# Patient Record
Sex: Female | Born: 1985 | Race: White | Hispanic: No | Marital: Single | Smoking: Never smoker
Health system: Southern US, Community
[De-identification: ages and names within clinical notes are randomized; demographics above are authoritative.]

## PROBLEM LIST (undated history)

## (undated) DIAGNOSIS — M419 Scoliosis, unspecified: Secondary | ICD-10-CM

## (undated) DIAGNOSIS — IMO0001 Reserved for inherently not codable concepts without codable children: Secondary | ICD-10-CM

## (undated) DIAGNOSIS — Q631 Lobulated, fused and horseshoe kidney: Secondary | ICD-10-CM

## (undated) DIAGNOSIS — H9190 Unspecified hearing loss, unspecified ear: Secondary | ICD-10-CM

## (undated) DIAGNOSIS — G629 Polyneuropathy, unspecified: Secondary | ICD-10-CM

## (undated) DIAGNOSIS — F419 Anxiety disorder, unspecified: Secondary | ICD-10-CM

## (undated) HISTORY — PX: TONSILLECTOMY: SUR1361

## (undated) HISTORY — PX: MYRINGOTOMY: SUR874

---
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
## 2007-04-22 ENCOUNTER — Emergency Department (HOSPITAL_COMMUNITY): Admission: EM | Admit: 2007-04-22 | Discharge: 2007-04-22 | Payer: Self-pay | Admitting: Emergency Medicine

## 2007-07-02 ENCOUNTER — Emergency Department (HOSPITAL_COMMUNITY): Admission: EM | Admit: 2007-07-02 | Discharge: 2007-07-02 | Payer: Self-pay | Admitting: Emergency Medicine

## 2007-08-04 ENCOUNTER — Emergency Department (HOSPITAL_COMMUNITY): Admission: EM | Admit: 2007-08-04 | Discharge: 2007-08-04 | Payer: Self-pay | Admitting: Emergency Medicine

## 2007-10-24 ENCOUNTER — Emergency Department (HOSPITAL_COMMUNITY): Admission: EM | Admit: 2007-10-24 | Discharge: 2007-10-25 | Payer: Self-pay | Admitting: Emergency Medicine

## 2007-11-13 ENCOUNTER — Ambulatory Visit (HOSPITAL_COMMUNITY): Admission: RE | Admit: 2007-11-13 | Discharge: 2007-11-13 | Payer: Self-pay | Admitting: Family Medicine

## 2008-01-13 ENCOUNTER — Ambulatory Visit (HOSPITAL_COMMUNITY): Admission: RE | Admit: 2008-01-13 | Discharge: 2008-01-13 | Payer: Self-pay | Admitting: Family Medicine

## 2008-03-27 ENCOUNTER — Emergency Department (HOSPITAL_COMMUNITY): Admission: EM | Admit: 2008-03-27 | Discharge: 2008-03-27 | Payer: Self-pay | Admitting: Emergency Medicine

## 2010-03-12 ENCOUNTER — Emergency Department (HOSPITAL_COMMUNITY): Admission: EM | Admit: 2010-03-12 | Discharge: 2010-03-12 | Payer: Self-pay | Admitting: Emergency Medicine

## 2010-06-18 ENCOUNTER — Emergency Department (HOSPITAL_COMMUNITY): Admission: EM | Admit: 2010-06-18 | Discharge: 2010-06-18 | Payer: Self-pay | Admitting: Emergency Medicine

## 2010-08-18 ENCOUNTER — Ambulatory Visit (HOSPITAL_COMMUNITY): Admission: RE | Admit: 2010-08-18 | Discharge: 2010-08-18 | Payer: Self-pay | Admitting: Internal Medicine

## 2010-09-19 ENCOUNTER — Ambulatory Visit: Payer: Self-pay | Admitting: Internal Medicine

## 2010-09-21 ENCOUNTER — Ambulatory Visit: Payer: Self-pay | Admitting: Internal Medicine

## 2010-09-21 ENCOUNTER — Ambulatory Visit (HOSPITAL_COMMUNITY): Admission: RE | Admit: 2010-09-21 | Discharge: 2010-09-21 | Payer: Self-pay | Admitting: Internal Medicine

## 2010-09-21 DIAGNOSIS — K219 Gastro-esophageal reflux disease without esophagitis: Secondary | ICD-10-CM | POA: Insufficient documentation

## 2010-09-21 DIAGNOSIS — R1013 Epigastric pain: Secondary | ICD-10-CM

## 2010-09-27 ENCOUNTER — Telehealth (INDEPENDENT_AMBULATORY_CARE_PROVIDER_SITE_OTHER): Payer: Self-pay

## 2010-09-27 ENCOUNTER — Encounter: Payer: Self-pay | Admitting: Gastroenterology

## 2010-09-28 ENCOUNTER — Encounter: Payer: Self-pay | Admitting: Gastroenterology

## 2010-09-29 ENCOUNTER — Encounter: Payer: Self-pay | Admitting: Internal Medicine

## 2010-09-29 LAB — CONVERTED CEMR LAB
ALT: <8 units/L (ref 0–35)
AST: 13 units/L (ref 0–37)
Albumin: 4.5 g/dL (ref 3.5–5.2)
Alkaline Phosphatase: 56 units/L (ref 39–117)
BUN: 11 mg/dL (ref 6–23)
Basophils Absolute: 0 10*3/uL (ref 0.0–0.1)
Basophils Relative: 1 % (ref 0–1)
CO2: 28 meq/L (ref 19–32)
Calcium: 9.7 mg/dL (ref 8.4–10.5)
Chloride: 106 meq/L (ref 96–112)
Creatinine, Ser: 0.67 mg/dL (ref 0.40–1.20)
Eosinophils Absolute: 0 10*3/uL (ref 0.0–0.7)
Eosinophils Relative: 1 % (ref 0–5)
Glucose, Bld: 90 mg/dL (ref 70–99)
HCT: 38.4 % (ref 36.0–46.0)
Hemoglobin: 11.8 g/dL — ABNORMAL LOW (ref 12.0–15.0)
Lipase: 32 units/L (ref 0–75)
Lymphocytes Relative: 29 % (ref 12–46)
Lymphs Abs: 2.4 10*3/uL (ref 0.7–4.0)
MCHC: 30.7 g/dL (ref 30.0–36.0)
MCV: 69.4 fL — ABNORMAL LOW (ref 78.0–100.0)
Monocytes Absolute: 0.6 10*3/uL (ref 0.1–1.0)
Monocytes Relative: 7 % (ref 3–12)
Neutro Abs: 5.1 10*3/uL (ref 1.7–7.7)
Neutrophils Relative %: 63 % (ref 43–77)
Platelets: 316 10*3/uL (ref 150–400)
Potassium: 4.4 meq/L (ref 3.5–5.3)
RBC: 5.53 M/uL — ABNORMAL HIGH (ref 3.87–5.11)
RDW: 15.1 % (ref 11.5–15.5)
Sodium: 143 meq/L (ref 135–145)
Total Bilirubin: 0.4 mg/dL (ref 0.3–1.2)
Total Protein: 7.1 g/dL (ref 6.0–8.3)
WBC: 8.2 10*3/uL (ref 4.0–10.5)

## 2010-10-02 ENCOUNTER — Ambulatory Visit (HOSPITAL_COMMUNITY): Admission: RE | Admit: 2010-10-02 | Discharge: 2010-10-02 | Payer: Self-pay | Admitting: Internal Medicine

## 2010-10-02 ENCOUNTER — Ambulatory Visit: Payer: Self-pay | Admitting: Internal Medicine

## 2010-10-02 DIAGNOSIS — D649 Anemia, unspecified: Secondary | ICD-10-CM

## 2010-10-11 ENCOUNTER — Ambulatory Visit: Payer: Self-pay | Admitting: Internal Medicine

## 2010-10-12 ENCOUNTER — Telehealth (INDEPENDENT_AMBULATORY_CARE_PROVIDER_SITE_OTHER): Payer: Self-pay

## 2010-12-20 ENCOUNTER — Ambulatory Visit (HOSPITAL_COMMUNITY)
Admission: RE | Admit: 2010-12-20 | Discharge: 2010-12-20 | Payer: Self-pay | Source: Home / Self Care | Attending: Family Medicine | Admitting: Family Medicine

## 2010-12-31 ENCOUNTER — Encounter: Payer: Self-pay | Admitting: Internal Medicine

## 2011-01-05 ENCOUNTER — Encounter
Admission: RE | Admit: 2011-01-05 | Discharge: 2011-01-09 | Payer: Self-pay | Source: Home / Self Care | Attending: Orthopedic Surgery | Admitting: Orthopedic Surgery

## 2011-01-11 NOTE — Letter (Signed)
Summary: EGD ORDER  EGD ORDER   Imported By: Ave Filter 09/19/2010 15:24:04  _____________________________________________________________________  External Attachment:    Type:   Image     Comment:   External Document

## 2011-01-11 NOTE — Letter (Signed)
Summary: CT SCAN ORDER  CT SCAN ORDER   Imported By: Ave Filter 09/29/2010 11:34:04  _____________________________________________________________________  External Attachment:    Type:   Image     Comment:   External Document

## 2011-01-11 NOTE — Assessment & Plan Note (Signed)
Summary: DROPPED OFF STOOL/SS   Returned one iFOBT and it was negative.      Allergies: 1)  ! Amoxicillin 2)  ! Lodine 3)  ! Pyridium  Other Orders: Immuno-chemical Fecal Occult (11914)  Appended Document: DROPPED OFF STOOL/SS noted

## 2011-01-11 NOTE — Progress Notes (Signed)
Summary: insurance wont pay for rx  Phone Note Call from Patient Call back at [facility] Phone [phone]   Caller: Patient Summary of Call: pt called, saw LSL yesterday, insurance wont pay for medication. pt requesting something else [facility] Initial call taken by: Hendricks Limes LPN,  October 12, 2010 3:10 PM     Appended Document: insurance wont pay for rx we can do Bentyl 10 mg by mouth qac and at bedtime. Disp# 120 no refills  Appended Document: insurance wont pay for rx [facility]- pt aware

## 2011-01-11 NOTE — Progress Notes (Signed)
Summary: phone note/ still having abd pain  Phone Note Call from Patient   Caller: Patient Summary of Call: Pt called and said the Dexilant is not helping. She has constant abd pain and sometimes it is severe. She would like to know what her next step is. I told her Dr. Jena Gauss is out of the office but i will ask Tana Coast, PA. Initial call taken by: Cloria Spring LPN,  September 27, 2010 2:18 PM     Appended Document: phone note/ still having abd pain Need CBC, CMET, lipase as next step. May ultimately need CT A/P but let's wait for labwork first.  Appended Document: phone note/ still having abd pain LMOM to call. Will print lab orders and have available to fax.  Appended Document: phone note/ still having abd pain Pt called and was informed. Lab order faxed to [facility].

## 2011-01-11 NOTE — Letter (Signed)
Summary: RADIOLOGY REPORT U/S ABD  RADIOLOGY REPORT U/S ABD   Imported By: Rexene Alberts 09/19/2010 16:38:38  _____________________________________________________________________  External Attachment:    Type:   Image     Comment:   External Document

## 2011-01-11 NOTE — Assessment & Plan Note (Signed)
Summary: move ov up w/ext next week per RMR/ss   Visit Type:  f/u Primary Care Provider:  [facility]  Chief Complaint:  follow up visit, still having abd pain, and worse at night.  History of Present Illness: Patient is here for f/u of abd pain. She has had EGD, CT A/P, ABD U/S, labs which have failed to reveal cause of her symptoms. She took trial of PPI without relief. 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 She had mild anemia with microcytosis (hgb 11.8, MCV 69). ifobt was negative.   Constant abd pain but around 8pm pain gets worse. Pain severe and lays in floor for hours. Lives alone. Pain medication not helping. No n/v. BM regular. Heartburn better. Dexilant couple of weeks and didn't seem to help so she stopped it. Pain in mid-abd. Movement does not affect pain nor does meal. No gu issues. Currently pain is at eight level but she appears very comfortable and smiles throughout OV. She denies sexual activity. Notes symptoms worse since 8/11 when college started back. This is her fifth semester but classes are much harder.   Current Medications (verified): 1)  Vicodin .... As Needed  Allergies (verified): 1)  ! Amoxicillin 2)  ! Lodine 3)  ! Pyridium  Past History:  Past Medical History: Scoliosis Horse shoe kidney    Review of Systems      See HPI  Vital Signs:  Patient profile:   25 year old female Height:      72 inches Weight:      204 pounds BMI:     27.77 Temp:     98.6 degrees F oral Pulse rate:   76 / minute BP sitting:   112 / 80  (left arm) Cuff size:   regular  Vitals Entered By: Hendricks Limes LPN (October 11, 2010 2:15 PM)  Physical Exam  General:  Well developed, well nourished, no acute distress.obese.   Head:  Normocephalic and atraumatic. Eyes:  sclera nonicteric Mouth:  op moist. poor dentition.   Lungs:  Clear throughout to auscultation. Heart:  Regular rate and rhythm; no murmurs, rubs,  or bruits. Abdomen:  Positive BS. Soft.  Mild mid abd tenderness to deep palpation. No rebound or guarding. No HSM or masses. No abd bruit or hernia.  Extremities:  No clubbing, cyanosis, edema or deformities noted. Neurologic:  Alert and  oriented x4;  grossly normal neurologically. Skin:  Intact without significant lesions or rashes. Psych:  Alert and cooperative. Normal mood and affect.  Impression & Recommendations:  Problem # 1:  EPIGASTRIC PAIN (ICD-789.06)  Epigastric/mid-abd pain unrelated to meals or movements. Patient c/o constant pain with increased severity in evenings around 8pm. C/O pain currently, rating at 8 level, but appears very comfortable on exam/smiles alot. Unlikely we are dealing with gb issue. ?functional abd pain or pain related to horseshoe kidney with intermittent obstruction? Will start antispasmotic. PR in 2 weeks. Will review films with radiologist.   Orders: Est. Patient Level II (04540)  Problem # 2:  ANEMIA (ICD-285.9) Recheck labs in two weeks. If any indication of IDA, consider TCS to complete w/u.  Orders: T-CBC w/Diff [phone]) T-Ferritin [phone]) T-Iron [phone]) T-Iron Binding Capacity (TIBC) ([id number]) Est. Patient Level II (02725)  Problem # 3:  GERD (ICD-530.81) Denies typical GERD symptoms. No longer on PPI. Didn't appreciate any improvement in abd pain on PPI. Orders: Est. Patient Level II (36644) Prescriptions: HYOMAX-SR 0.375 MG XR12H-TAB (HYOSCYAMINE SULFATE) one by mouth  two times a day for abd pain  #60 x 2   Entered and Authorized by:   Leanna Battles. Dixon Boos   Signed by:   Leanna Battles Lewis PA-C on 10/11/2010   Method used:   [facility]        [phone]retail[facility]       [address]       [place], [place]  [address]       Ph: [phone] or [phone]       Fax: [phone]   RxID:   [id number]

## 2011-01-11 NOTE — Letter (Signed)
Summary: LABS  LABS   Imported By: Rexene Alberts 09/19/2010 16:39:50  _____________________________________________________________________  External Attachment:    Type:   Image     Comment:   External Document

## 2011-01-11 NOTE — Miscellaneous (Signed)
Summary: Orders Update  Clinical Lists Changes  Orders: Added new Test order of T-CBC w/Diff [id number]) - Signed Added new Test order of T-Comprehensive Metabolic Panel [phone]) - Signed Added new Test order of T-Lipase [phone]) - Signed

## 2011-01-11 NOTE — Assessment & Plan Note (Signed)
Summary: ABD PAIN/LAW   Visit Type:  Initial Consult Referring Indiya Izquierdo:  Sherwood Gambler Primary Care Nataliee Shurtz:  [facility]  Chief Complaint:  abdominal pain.  History of Present Illness: 25 year old lady sent over out of the coutrtesy of Dr. Sherwood Gambler to further evaluate a two-month history of progressive worsening epigastric pain. She states she has this pain constantly, unrelenting for 2 months - now 24/ 7. She's also had significant heartburn symptoms over the past couple of weeks. No dysphagia some early satiety; no nausea or vomiting. no melena or hematochezia; she denies constipation or diarrhea. She reported she had an abdominal ultrasound which did not show any significant findings but I do not have a report available for review at this time.  She reportedly had a battery of the labs ordered including a CMP, amylase and CBC but Dr. Sharyon Medicus office states she did not have any lab work on this patietn aside from a negative preganacy test;  urinalysis was positive for ketones and leukocyte esterase. She was treated with a course of Cipro, howeve,  this did not help her symptoms. She denies any flank pain.  She denies any fever or chills. She denies weight loss. There is no family history of chronic gastrointestinal / liver illness.  She denies tobacco alcohol, NSAIDs or illicit drugs.  course of vicodin has had minimal effect on her symptoms. Eating a meal does not effect the pain anyway.    Current Medications (verified): 1)  Vicodin .... As Needed  Allergies (verified): 1)  ! Amoxicillin 2)  ! Lodine 3)  ! Pyridium  Past History:  Family History: Last updated: 09/19/2010 Father: Living age 39   healthy Mother:Living age 65   healthy Siblings: None  Social History: Last updated: 09/19/2010 Marital Status: Single Children: None Occupation: Mining engineer  Past Medical History: Scoliosis Horse shoe kidney (per pt)  Past Surgical History: Tubes in ears  ( about  22 times per pt ) Tonsils and adenoids  Family History: Father: Living age 44   healthy Mother:Living age 23   healthy Siblings: None  Social History: Marital Status: Single Children: None Occupation: Mining engineer  Vital Signs:  Patient profile:   25 year old female Height:      72 inches Weight:      204 pounds BMI:     27.77 Temp:     97.8 degrees F oral Pulse rate:   66 / minute BP sitting:   100 / 76  (left arm) Cuff size:   large  Vitals Entered By: Cloria Spring LPN (September 19, 2010 2:38 PM)  Physical Exam  General:  the patient is alert conversant appears comfortable and pleasant. Somewhat disheveled Lungs:  clear to auscultation Heart:  regular rate and rhythm without murmur gallop rub Abdomen:  nondistended positive bowel sounds she does have tenderness localized to the epigastric area to palpation. No appreciable mass or hepatosplenomegaly Rectal:  good sphincter tone. No masses in the rectal vault. No stool in the rectal vault. Mucous is Hemoccult negative.  Impression & Recommendations: Impression: 25 year old lady with a two-month history of progressively worsening epigastric pain along with symptoms she describes as heartburn particularly past 2-3 weeks. She is not improved wtih a course of Cipro for cystitis. She reportedly had an abdominal ultrasound which was unrevealing as to the cause of her symptoms. She does have some early satiety symptoms. Symptoms are atypical for both gallbladder disease or peptic ulcer disease. She  does appea to have a reflux component. Further evaluation is warranted.  Recommendations: Diagnostic EGD in the near future. I discussed risks, benefits, limitations, imponderables and alternatives with this nice ladyr. Her questions have been answered. I believe she understands and is agreeable.  We'll also give her a two-week course of Dexalant 60 mg orally daily - samples provided from the office to see if  this has a  positive impact on her symptoms. Further recommendations to follow after  EGD has been performed. I want to thank Dr. Sherwood Gambler for allowing me to see this nice lady today.   addendum:   ultrasound report has arrived. Hepatobiliary tree appeared normal. Pancreas not seen because of overlying bowel gas.  Appended Document: Orders Update    Clinical Lists Changes  Problems: Added new problem of GERD (ICD-530.81) Added new problem of EPIGASTRIC PAIN (ICD-789.06) Orders: Added new Service order of Consultation Level IV [phone]) - Signed

## 2011-01-16 ENCOUNTER — Encounter: Payer: Self-pay | Admitting: Physical Therapy

## 2011-01-18 ENCOUNTER — Ambulatory Visit: Payer: Medicaid Other | Attending: Orthopedic Surgery | Admitting: Physical Therapy

## 2011-01-18 ENCOUNTER — Encounter: Payer: Self-pay | Admitting: Physical Therapy

## 2011-01-18 DIAGNOSIS — IMO0001 Reserved for inherently not codable concepts without codable children: Secondary | ICD-10-CM | POA: Insufficient documentation

## 2011-01-18 DIAGNOSIS — M2550 Pain in unspecified joint: Secondary | ICD-10-CM | POA: Insufficient documentation

## 2011-01-18 DIAGNOSIS — R293 Abnormal posture: Secondary | ICD-10-CM | POA: Insufficient documentation

## 2011-01-22 ENCOUNTER — Encounter: Payer: Medicaid Other | Admitting: Physical Therapy

## 2011-02-28 LAB — URINE MICROSCOPIC-ADD ON

## 2011-02-28 LAB — URINALYSIS, ROUTINE W REFLEX MICROSCOPIC
Bilirubin Urine: NEGATIVE
Glucose, UA: NEGATIVE mg/dL
Ketones, ur: NEGATIVE mg/dL
Nitrite: POSITIVE — AB
Protein, ur: 100 mg/dL — AB
Specific Gravity, Urine: 1.025 (ref 1.005–1.030)
Urobilinogen, UA: 0.2 mg/dL (ref 0.0–1.0)
pH: 6.5 (ref 5.0–8.0)

## 2011-02-28 LAB — POCT I-STAT, CHEM 8
BUN: 15 mg/dL (ref 6–23)
Calcium, Ion: 1.2 mmol/L (ref 1.12–1.32)
Chloride: 106 mEq/L (ref 96–112)
Creatinine, Ser: 0.6 mg/dL (ref 0.4–1.2)
Glucose, Bld: 97 mg/dL (ref 70–99)
HCT: 42 % (ref 36.0–46.0)
Hemoglobin: 14.3 g/dL (ref 12.0–15.0)
Potassium: 4.1 mEq/L (ref 3.5–5.1)
Sodium: 140 mEq/L (ref 135–145)
TCO2: 27 mmol/L (ref 0–100)

## 2011-02-28 LAB — HCG, QUANTITATIVE, PREGNANCY: hCG, Beta Chain, Quant, S: <2 m[IU]/mL (ref <5)

## 2011-02-28 LAB — PREGNANCY, URINE: Preg Test, Ur: POSITIVE

## 2011-03-27 ENCOUNTER — Emergency Department (HOSPITAL_COMMUNITY)
Admission: EM | Admit: 2011-03-27 | Discharge: 2011-03-27 | Disposition: A | Discharge status: Home / Self Care | Payer: Medicaid Other | Attending: Emergency Medicine | Admitting: Emergency Medicine

## 2011-03-27 DIAGNOSIS — M79609 Pain in unspecified limb: Secondary | ICD-10-CM | POA: Insufficient documentation

## 2011-03-27 DIAGNOSIS — IMO0001 Reserved for inherently not codable concepts without codable children: Secondary | ICD-10-CM | POA: Insufficient documentation

## 2011-03-27 LAB — POCT I-STAT, CHEM 8
BUN: 11 mg/dL (ref 6–23)
Calcium, Ion: 1.24 mmol/L (ref 1.12–1.32)
Chloride: 103 mEq/L (ref 96–112)
Creatinine, Ser: 0.9 mg/dL (ref 0.4–1.2)
Glucose, Bld: 82 mg/dL (ref 70–99)
HCT: 39 % (ref 36.0–46.0)
Hemoglobin: 13.3 g/dL (ref 12.0–15.0)
Potassium: 3.7 mEq/L (ref 3.5–5.1)
Sodium: 142 mEq/L (ref 135–145)
TCO2: 28 mmol/L (ref 0–100)

## 2011-04-27 NOTE — Procedures (Signed)
   NAMECONNER, [name]                           ACCOUNT NO.:  [id number]   MEDICAL RECORD NO.:  [id number]                    PATIENT TYPE:   LOCATION:                                       FACILITY:   PHYSICIAN:  Edward L. Juanetta Gosling, M.D.             DATE OF BIRTH:   DATE OF PROCEDURE:  09/07/2003  DATE OF DISCHARGE:                                EKG INTERPRETATION   TIME AND DATE:  1949 on 09/07/2003   INTERPRETATION:  The rhythm is sinus rhythm with a rate in the 90s.  There  are Q waves inferiorly which could indicate a previous inferior myocardial  infarction.  There is suggestion of a left ventricular hypertrophy.  ST-T  wave changes appear nonspecific, but may be related to the LVH.   IMPRESSION:  Abnormal electrocardiogram.      ___________________________________________                                            Oneal Deputy. Juanetta Gosling, M.D.   ELH/MEDQ  D:  09/16/2003  T:  09/17/2003  Job:  [id number]

## 2011-04-27 NOTE — Op Note (Signed)
   Wendy [name], Wendy [name]                         ACCOUNT NO.:  [id number]   MEDICAL RECORD NO.:  [id number]                   PATIENT TYPE:  AMB   LOCATION:  [facility]                                  FACILITY:  [facility]   PHYSICIAN:  Jefry H. Pollyann Kennedy, M.D.                DATE OF BIRTH:  01-30-1986   DATE OF PROCEDURE:  09/17/2002  DATE OF DISCHARGE:                                 OPERATIVE REPORT   PREOPERATIVE DIAGNOSES:  1. Chronic eustachian tube dysfunction.  2. 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   POSTOPERATIVE DIAGNOSES:  1. Chronic eustachian tube dysfunction.  2. 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   PROCEDURE:  Revision T tube placement, right ear.   SURGEON:  Jefry H. Pollyann Kennedy, M.D.   ANESTHESIA:  Inhalation.   COMPLICATIONS:  None.   FINDINGS:  Displaced T tube up against the posterior annulus with thin  mucoid middle ear effusion.  Left side clear with T tube in place.   HISTORY:  [name] 25 year old with chronic eustachian tube dysfunction and chronic  sensor neural hearing loss.  Risks, benefits, alternatives, and  complications of the procedure explained to the parents and same agreed to  surgery.   DESCRIPTION OF PROCEDURE:  The patient was taken to the operating room and  placed on the operating table in [name] supine position.  Following induction of  mask inhalation anesthesia, the ears were examined using operating  microscope bilaterally.  The tube was removed from the right side.  [name] new  anterior-inferior myringotomy incision was created in the inferior quadrant,  and then the mucoid effusion was aspirated.  [name] new modified T tube was  placed without difficulty and secured in position.  Cortisporin was  instilled into the ear canal.  The patient was then awakened from anesthesia  and transferred to recovery in stable condition.                               Jefry H. Pollyann Kennedy, M.D.    JHR/MEDQ  D:  09/17/2002  T:  09/17/2002  Job:  [id number]

## 2011-06-08 ENCOUNTER — Emergency Department (HOSPITAL_COMMUNITY): Payer: Medicaid Other

## 2011-06-08 ENCOUNTER — Emergency Department (HOSPITAL_COMMUNITY)
Admission: EM | Admit: 2011-06-08 | Discharge: 2011-06-09 | Disposition: A | Discharge status: Home / Self Care | Payer: Medicaid Other | Attending: Emergency Medicine | Admitting: Emergency Medicine

## 2011-06-08 DIAGNOSIS — R0609 Other forms of dyspnea: Secondary | ICD-10-CM | POA: Insufficient documentation

## 2011-06-08 DIAGNOSIS — R0989 Other specified symptoms and signs involving the circulatory and respiratory systems: Secondary | ICD-10-CM | POA: Insufficient documentation

## 2011-06-08 DIAGNOSIS — R079 Chest pain, unspecified: Secondary | ICD-10-CM | POA: Insufficient documentation

## 2011-06-08 LAB — CBC
HCT: 41.1 % (ref 36.0–46.0)
Hemoglobin: 12.9 g/dL (ref 12.0–15.0)
MCH: 22.3 pg — ABNORMAL LOW (ref 26.0–34.0)
MCHC: 31.4 g/dL (ref 30.0–36.0)
MCV: 71.1 fL — ABNORMAL LOW (ref 78.0–100.0)
Platelets: 294 10*3/uL (ref 150–400)
RBC: 5.78 MIL/uL — ABNORMAL HIGH (ref 3.87–5.11)
RDW: 14.2 % (ref 11.5–15.5)
WBC: 8.3 10*3/uL (ref 4.0–10.5)

## 2011-06-08 LAB — COMPREHENSIVE METABOLIC PANEL
ALT: 11 U/L (ref 0–35)
AST: 15 U/L (ref 0–37)
Albumin: 4 g/dL (ref 3.5–5.2)
Alkaline Phosphatase: 75 U/L (ref 39–117)
Potassium: 3.8 mEq/L (ref 3.5–5.1)
Sodium: 138 mEq/L (ref 135–145)
Total Protein: 8 g/dL (ref 6.0–8.3)

## 2011-06-08 LAB — TROPONIN I: Troponin I: <0.3 ng/mL (ref <0.30)

## 2011-06-08 LAB — CK TOTAL AND CKMB (NOT AT ARMC)
CK, MB: 1.3 ng/mL (ref 0.3–4.0)
Relative Index: INVALID (ref 0.0–2.5)
Total CK: 69 U/L (ref 7–177)

## 2011-08-01 ENCOUNTER — Other Ambulatory Visit (HOSPITAL_COMMUNITY): Payer: Self-pay | Admitting: Family Medicine

## 2011-08-01 ENCOUNTER — Ambulatory Visit (HOSPITAL_COMMUNITY)
Admission: RE | Admit: 2011-08-01 | Discharge: 2011-08-01 | Disposition: A | Discharge status: Home / Self Care | Payer: Medicaid Other | Source: Ambulatory Visit | Attending: Family Medicine | Admitting: Family Medicine

## 2011-08-01 DIAGNOSIS — M25569 Pain in unspecified knee: Secondary | ICD-10-CM | POA: Insufficient documentation

## 2011-09-04 LAB — GC/CHLAMYDIA PROBE AMP, GENITAL
Chlamydia, DNA Probe: NEGATIVE
GC Probe Amp, Genital: NEGATIVE

## 2011-09-04 LAB — WET PREP, GENITAL: Trich, Wet Prep: NONE SEEN

## 2011-09-05 ENCOUNTER — Emergency Department (INDEPENDENT_AMBULATORY_CARE_PROVIDER_SITE_OTHER): Payer: No Typology Code available for payment source

## 2011-09-05 ENCOUNTER — Emergency Department (HOSPITAL_BASED_OUTPATIENT_CLINIC_OR_DEPARTMENT_OTHER)
Admission: EM | Admit: 2011-09-05 | Discharge: 2011-09-05 | Disposition: A | Discharge status: Home / Self Care | Payer: No Typology Code available for payment source | Attending: Emergency Medicine | Admitting: Emergency Medicine

## 2011-09-05 ENCOUNTER — Encounter: Payer: Self-pay | Admitting: *Deleted

## 2011-09-05 DIAGNOSIS — S239XXA Sprain of unspecified parts of thorax, initial encounter: Secondary | ICD-10-CM | POA: Insufficient documentation

## 2011-09-05 DIAGNOSIS — M542 Cervicalgia: Secondary | ICD-10-CM

## 2011-09-05 DIAGNOSIS — M4120 Other idiopathic scoliosis, site unspecified: Secondary | ICD-10-CM

## 2011-09-05 DIAGNOSIS — M546 Pain in thoracic spine: Secondary | ICD-10-CM

## 2011-09-05 DIAGNOSIS — S139XXA Sprain of joints and ligaments of unspecified parts of neck, initial encounter: Secondary | ICD-10-CM | POA: Insufficient documentation

## 2011-09-05 DIAGNOSIS — S161XXA Strain of muscle, fascia and tendon at neck level, initial encounter: Secondary | ICD-10-CM

## 2011-09-05 DIAGNOSIS — Y92410 Unspecified street and highway as the place of occurrence of the external cause: Secondary | ICD-10-CM | POA: Insufficient documentation

## 2011-09-05 DIAGNOSIS — IMO0002 Reserved for concepts with insufficient information to code with codable children: Secondary | ICD-10-CM

## 2011-09-05 DIAGNOSIS — M47814 Spondylosis without myelopathy or radiculopathy, thoracic region: Secondary | ICD-10-CM

## 2011-09-05 HISTORY — DX: Anxiety disorder, unspecified: F41.9

## 2011-09-05 HISTORY — DX: Unspecified hearing loss, unspecified ear: H91.90

## 2011-09-05 HISTORY — DX: Scoliosis, unspecified: M41.9

## 2011-09-05 HISTORY — DX: Lobulated, fused and horseshoe kidney: Q63.1

## 2011-09-05 HISTORY — DX: Reserved for inherently not codable concepts without codable children: IMO0001

## 2011-09-05 HISTORY — DX: Polyneuropathy, unspecified: G62.9

## 2011-09-05 MED ORDER — CYCLOBENZAPRINE HCL 10 MG PO TABS: 10.0000 mg | ORAL_TABLET | Freq: Two times a day (BID) | ORAL | Status: AC | PRN

## 2011-09-05 MED ORDER — ONDANSETRON 8 MG PO TBDP
8.0000 mg | ORAL_TABLET | Freq: Once | ORAL | Status: AC
  Administered 2011-09-05: 8 mg via ORAL
  Filled 2011-09-05: qty 1

## 2011-09-05 MED ORDER — IBUPROFEN 800 MG PO TABS: 800.0000 mg | ORAL_TABLET | Freq: Three times a day (TID) | ORAL | Status: AC

## 2011-09-05 MED ORDER — HYDROCODONE-ACETAMINOPHEN 5-325 MG PO TABS: 1.0000 | ORAL_TABLET | ORAL | Status: AC | PRN
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
## 2011-09-18 LAB — URINE MICROSCOPIC-ADD ON

## 2011-09-18 LAB — URINALYSIS, ROUTINE W REFLEX MICROSCOPIC
Bilirubin Urine: NEGATIVE
Glucose, UA: NEGATIVE
Specific Gravity, Urine: >1.03 — ABNORMAL HIGH
Urobilinogen, UA: 0.2

## 2011-09-18 LAB — URINE CULTURE: Colony Count: >=100000

## 2011-09-24 LAB — RAPID URINE DRUG SCREEN, HOSP PERFORMED
Amphetamines: NOT DETECTED
Barbiturates: NOT DETECTED
Opiates: NOT DETECTED

## 2011-09-24 LAB — BASIC METABOLIC PANEL
CO2: 32
Chloride: 103
Creatinine, Ser: 0.72
GFR calc Af Amer: >60
Potassium: 3.9
Sodium: 140

## 2011-09-24 LAB — DIFFERENTIAL
Basophils Absolute: 0
Eosinophils Absolute: 0
Lymphs Abs: 2.9
Neutro Abs: 5.8

## 2011-09-24 LAB — CBC
HCT: 37.9
Hemoglobin: 11.9 — ABNORMAL LOW
MCHC: 31.5
MCV: 67.7 — ABNORMAL LOW
RDW: 16.9 — ABNORMAL HIGH

## 2012-01-11 ENCOUNTER — Ambulatory Visit: Payer: No Typology Code available for payment source | Admitting: Physical Therapy

## 2012-01-16 ENCOUNTER — Ambulatory Visit: Payer: PRIVATE HEALTH INSURANCE | Attending: Orthopedic Surgery | Admitting: Physical Therapy

## 2012-01-16 DIAGNOSIS — M6281 Muscle weakness (generalized): Secondary | ICD-10-CM | POA: Insufficient documentation

## 2012-01-16 DIAGNOSIS — M25669 Stiffness of unspecified knee, not elsewhere classified: Secondary | ICD-10-CM | POA: Insufficient documentation

## 2012-01-16 DIAGNOSIS — IMO0001 Reserved for inherently not codable concepts without codable children: Secondary | ICD-10-CM | POA: Insufficient documentation

## 2012-01-16 DIAGNOSIS — M25569 Pain in unspecified knee: Secondary | ICD-10-CM | POA: Insufficient documentation

## 2012-01-25 ENCOUNTER — Ambulatory Visit: Payer: PRIVATE HEALTH INSURANCE | Admitting: Physical Therapy

## 2012-02-01 ENCOUNTER — Encounter: Payer: No Typology Code available for payment source | Admitting: Physical Therapy

## 2012-02-04 ENCOUNTER — Ambulatory Visit: Payer: PRIVATE HEALTH INSURANCE | Admitting: Physical Therapy

## 2012-02-13 ENCOUNTER — Ambulatory Visit: Payer: PRIVATE HEALTH INSURANCE | Attending: Orthopedic Surgery | Admitting: Physical Therapy

## 2012-02-13 DIAGNOSIS — M25569 Pain in unspecified knee: Secondary | ICD-10-CM | POA: Insufficient documentation

## 2012-02-13 DIAGNOSIS — M6281 Muscle weakness (generalized): Secondary | ICD-10-CM | POA: Insufficient documentation

## 2012-02-13 DIAGNOSIS — IMO0001 Reserved for inherently not codable concepts without codable children: Secondary | ICD-10-CM | POA: Insufficient documentation

## 2012-02-13 DIAGNOSIS — M25669 Stiffness of unspecified knee, not elsewhere classified: Secondary | ICD-10-CM | POA: Insufficient documentation

## 2012-02-15 ENCOUNTER — Encounter: Payer: No Typology Code available for payment source | Admitting: Physical Therapy

## 2013-06-22 ENCOUNTER — Other Ambulatory Visit (HOSPITAL_COMMUNITY): Payer: Self-pay | Admitting: Family Medicine

## 2013-06-22 ENCOUNTER — Ambulatory Visit (HOSPITAL_COMMUNITY)
Admission: RE | Admit: 2013-06-22 | Discharge: 2013-06-22 | Disposition: A | Discharge status: Home / Self Care | Payer: Medicaid Other | Source: Ambulatory Visit | Attending: Family Medicine | Admitting: Family Medicine

## 2013-06-22 DIAGNOSIS — K5900 Constipation, unspecified: Secondary | ICD-10-CM

## 2013-06-22 DIAGNOSIS — R933 Abnormal findings on diagnostic imaging of other parts of digestive tract: Secondary | ICD-10-CM | POA: Insufficient documentation

## 2013-06-22 DIAGNOSIS — O3400 Maternal care for unspecified congenital malformation of uterus, unspecified trimester: Secondary | ICD-10-CM

## 2013-06-24 ENCOUNTER — Emergency Department (HOSPITAL_COMMUNITY)
Admission: EM | Admit: 2013-06-24 | Discharge: 2013-06-25 | Disposition: A | Discharge status: Home / Self Care | Payer: Medicaid Other | Attending: Emergency Medicine | Admitting: Emergency Medicine

## 2013-06-24 ENCOUNTER — Encounter (HOSPITAL_COMMUNITY): Payer: Self-pay | Admitting: Emergency Medicine

## 2013-06-24 DIAGNOSIS — Z8669 Personal history of other diseases of the nervous system and sense organs: Secondary | ICD-10-CM | POA: Insufficient documentation

## 2013-06-24 DIAGNOSIS — N390 Urinary tract infection, site not specified: Secondary | ICD-10-CM | POA: Insufficient documentation

## 2013-06-24 DIAGNOSIS — Z3202 Encounter for pregnancy test, result negative: Secondary | ICD-10-CM | POA: Insufficient documentation

## 2013-06-24 DIAGNOSIS — F411 Generalized anxiety disorder: Secondary | ICD-10-CM | POA: Insufficient documentation

## 2013-06-24 DIAGNOSIS — M549 Dorsalgia, unspecified: Secondary | ICD-10-CM | POA: Insufficient documentation

## 2013-06-24 DIAGNOSIS — K5904 Chronic idiopathic constipation: Secondary | ICD-10-CM

## 2013-06-24 DIAGNOSIS — Z79899 Other long term (current) drug therapy: Secondary | ICD-10-CM | POA: Insufficient documentation

## 2013-06-24 DIAGNOSIS — K5900 Constipation, unspecified: Secondary | ICD-10-CM | POA: Insufficient documentation

## 2013-06-24 DIAGNOSIS — Z8739 Personal history of other diseases of the musculoskeletal system and connective tissue: Secondary | ICD-10-CM | POA: Insufficient documentation

## 2013-06-24 LAB — URINALYSIS, ROUTINE W REFLEX MICROSCOPIC
Nitrite: POSITIVE — AB
Specific Gravity, Urine: 1.03 (ref 1.005–1.030)
Urobilinogen, UA: 1 mg/dL (ref 0.0–1.0)
pH: 6 (ref 5.0–8.0)

## 2013-06-24 LAB — POCT PREGNANCY, URINE: Preg Test, Ur: NEGATIVE

## 2013-06-24 LAB — CBC WITH DIFFERENTIAL/PLATELET
Basophils Absolute: 0 10*3/uL (ref 0.0–0.1)
Eosinophils Absolute: 0.1 10*3/uL (ref 0.0–0.7)
Lymphocytes Relative: 25 % (ref 12–46)
MCHC: 33.3 g/dL (ref 30.0–36.0)
Monocytes Absolute: 1.1 10*3/uL — ABNORMAL HIGH (ref 0.1–1.0)
Neutro Abs: 9.1 10*3/uL — ABNORMAL HIGH (ref 1.7–7.7)
Neutrophils Relative %: 66 % (ref 43–77)
RDW: 16 % — ABNORMAL HIGH (ref 11.5–15.5)

## 2013-06-24 LAB — COMPREHENSIVE METABOLIC PANEL
ALT: 14 U/L (ref 0–35)
Calcium: 9.8 mg/dL (ref 8.4–10.5)
Creatinine, Ser: 0.76 mg/dL (ref 0.50–1.10)
GFR calc Af Amer: >90 mL/min (ref >90)
Glucose, Bld: 108 mg/dL — ABNORMAL HIGH (ref 70–99)
Sodium: 139 mEq/L (ref 135–145)
Total Protein: 7.6 g/dL (ref 6.0–8.3)

## 2013-06-24 LAB — URINE MICROSCOPIC-ADD ON

## 2013-06-24 NOTE — ED Notes (Signed)
Patient states that she has not had a bowel movement in 5 weeks.  Patient has been using senokot and dulcolax without any effect.  Patient with abdominal pain, some nausea, no vomiting.

## 2013-06-25 ENCOUNTER — Emergency Department (HOSPITAL_COMMUNITY): Payer: Medicaid Other

## 2013-06-25 ENCOUNTER — Encounter (HOSPITAL_COMMUNITY): Payer: Self-pay | Admitting: Radiology

## 2013-06-25 MED ORDER — CEPHALEXIN 500 MG PO CAPS: 500.0000 mg | ORAL_CAPSULE | Freq: Two times a day (BID) | ORAL | Status: DC

## 2013-06-25 MED ORDER — GLYCERIN (LAXATIVE) 2 G RE SUPP: 1.0000 | Freq: Once | RECTAL | Status: DC

## 2013-06-25 MED ORDER — DEXTROSE 5 % IV SOLN
1.0000 g | Freq: Once | INTRAVENOUS | Status: AC
  Administered 2013-06-25: 1 g via INTRAVENOUS
  Filled 2013-06-25: qty 10

## 2013-06-25 NOTE — ED Provider Notes (Signed)
History    CSN: [id number] Arrival date & time 06/24/13  2150  First MD Initiated Contact with Patient 06/25/13 0009     Chief Complaint  Patient presents with  . Constipation  . Abdominal Pain   (Consider location/radiation/quality/duration/timing/severity/associated sxs/prior Treatment) HPI Comments: Pt comes in with cc of abd pain. Pt reports that she hasnt had a BM in 5 weeks. She is now not passing flatus that she can think off. She has some diffuse abd pain and back pain. She denies any hx of abd surgery, no n/v/f/c. Pt has no anorexia. Pt states that she has been told she has IBS. Has attempted several OTC meds, with no resultant BM.  Patient is a 27 y.o. female presenting with constipation and abdominal pain. The history is provided by the patient and medical records.  Constipation Associated symptoms: abdominal pain   Associated symptoms: no dysuria, no nausea and no vomiting   Abdominal Pain Associated symptoms include abdominal pain. Pertinent negatives include no chest pain, no headaches and no shortness of breath.   Past Medical History  Diagnosis Date  . Horseshoe kidney   . Scoliosis   . Hearing impaired person   . Anxiety   . Neuropathy     arms, legs and low back   Past Surgical History  Procedure Laterality Date  . Myringotomy      approximately 20 times  . Tonsillectomy     No family history on file. History  Substance Use Topics  . Smoking status: Never Smoker   . Smokeless tobacco: Not on file  . Alcohol Use: No   OB History   Grav Para Term Preterm Abortions TAB SAB Ect Mult Living                 Review of Systems  Constitutional: Positive for activity change.  HENT: Negative for neck pain.   Respiratory: Negative for shortness of breath.   Cardiovascular: Negative for chest pain.  Gastrointestinal: Positive for abdominal pain and constipation. Negative for nausea and vomiting.  Genitourinary: Negative for dysuria.  Neurological:  Negative for headaches.    Allergies  Amoxicillin; Iodine; Lodine; and Phenazopyridine hcl  Home Medications   Current Outpatient Rx  Name  Route  Sig  Dispense  Refill  . docusate sodium (COLACE) 100 MG capsule   Oral   Take 100 mg by mouth 2 (two) times daily.         Marland Kitchen HYDROcodone-acetaminophen (VICODIN) 5-500 MG per tablet   Oral   Take 1 tablet by mouth every 6 (six) hours as needed for pain.          Marland Kitchen ibuprofen (ADVIL,MOTRIN) 800 MG tablet   Oral   Take 800 mg by mouth every 8 (eight) hours as needed for pain.          Marland Kitchen oxyCODONE-acetaminophen (PERCOCET) 5-325 MG per tablet   Oral   Take 1 tablet by mouth every 4 (four) hours as needed for pain.           BP 103/65  Pulse 74  Temp(Src) 97.7 F (36.5 C) (Oral)  Resp 16  SpO2 100%  LMP 06/21/2013 Physical Exam  Nursing note and vitals reviewed. Constitutional: She is oriented to person, place, and time. She appears well-developed and well-nourished.  HENT:  Head: Normocephalic and atraumatic.  Eyes: EOM are normal. Pupils are equal, round, and reactive to light.  Neck: Neck supple.  Cardiovascular: Normal rate, regular rhythm and normal heart sounds.  No murmur heard. Pulmonary/Chest: Effort normal. No respiratory distress.  Abdominal: Soft. Bowel sounds are normal. She exhibits no distension. There is tenderness. There is no rebound and no guarding.  Diffuse abd tenderness  Neurological: She is alert and oriented to person, place, and time.  Skin: Skin is warm and dry.    ED Course  Procedures (including critical care time) Labs Reviewed  CBC WITH DIFFERENTIAL - Abnormal; Notable for the following:    WBC 13.8 (*)    RBC 5.74 (*)    MCV 69.0 (*)    MCH 23.0 (*)    RDW 16.0 (*)    Neutro Abs 9.1 (*)    Monocytes Absolute 1.1 (*)    All other components within normal limits  COMPREHENSIVE METABOLIC PANEL - Abnormal; Notable for the following:    Glucose, Bld 108 (*)    All other components  within normal limits  URINALYSIS, ROUTINE W REFLEX MICROSCOPIC - Abnormal; Notable for the following:    Color, Urine AMBER (*)    APPearance TURBID (*)    Hgb urine dipstick LARGE (*)    Bilirubin Urine SMALL (*)    Ketones, ur 15 (*)    Protein, ur 30 (*)    Nitrite POSITIVE (*)    Leukocytes, UA LARGE (*)    All other components within normal limits  URINE MICROSCOPIC-ADD ON - Abnormal; Notable for the following:    Squamous Epithelial / LPF FEW (*)    Bacteria, UA MANY (*)    Crystals CA OXALATE CRYSTALS (*)    All other components within normal limits  URINE CULTURE  POCT PREGNANCY, URINE   Dg Abd Acute W/chest  06/25/2013   *RADIOLOGY REPORT*  Clinical Data: Abdominal pain and constipation.  ACUTE ABDOMEN SERIES (ABDOMEN 2 VIEW & CHEST 1 VIEW)  Comparison: 06/08/2011  Findings: Lungs are clear bilaterally. Heart and mediastinum are within normal limits.  No evidence of free air.  The patient has a navel ring.  Large amount of stool along the right side of the colon and in the pelvis. Mild curvature of the lumbar spine could be related to patient positioning or mild scoliosis.  No significant small bowel dilatation.  IMPRESSION: No acute chest abnormalities.  Large amount of stool in the pelvis and right abdomen.  Nonspecific bowel gas pattern.   Original Report Authenticated By: Richarda Overlie, M.D.   No diagnosis found.  MDM  Pt comes in with cc of abd pain, back pain, no BM x 5 weeks. AAS shows multiple air fluid levels, no defiinite signs of SBO. She states that she is not passing any flatus, so we will get CT with oral contrast to r/o SBO. Pt also has UTI per labs, which could be causing the back pain and abd pain - so ceftriaxone given in the ER.  Derwood Kaplan, MD 06/25/13 [phone]

## 2013-06-25 NOTE — ED Notes (Signed)
Gwen Brincefield to be contacted if pt requests. Phone number listed under Emergency contact information

## 2013-06-25 NOTE — ED Notes (Signed)
PT returned from CT

## 2013-06-25 NOTE — ED Notes (Signed)
CT notified pt finished drinking contrast  

## 2013-06-27 LAB — URINE CULTURE: Colony Count: >=100000

## 2013-06-28 ENCOUNTER — Telehealth (HOSPITAL_COMMUNITY): Payer: Self-pay | Admitting: Emergency Medicine

## 2013-06-28 NOTE — ED Notes (Signed)
Post ED Visit - Positive Culture Follow-up  Culture report reviewed by antimicrobial stewardship pharmacist: []  Wes Dulaney, Pharm.D., BCPS []  Celedonio Miyamoto, Pharm.D., BCPS [x]  Georgina Pillion, Pharm.D., BCPS []  [place], [address].D., BCPS, AAHIVP []  Estella Husk, Pharm.D., BCPS, AAHIVP  Positive urine culture Treated with Keflex, organism sensitive to the same and no further patient follow-up is required at this time.  Wendy [name] 06/28/2013, 11:31 AM

## 2013-07-23 ENCOUNTER — Ambulatory Visit: Payer: Medicaid Other | Admitting: Gastroenterology

## 2013-08-07 ENCOUNTER — Emergency Department (HOSPITAL_COMMUNITY)
Admission: EM | Admit: 2013-08-07 | Discharge: 2013-08-07 | Disposition: A | Discharge status: Home / Self Care | Payer: Medicaid Other | Attending: Emergency Medicine | Admitting: Emergency Medicine

## 2013-08-07 ENCOUNTER — Encounter (HOSPITAL_COMMUNITY): Payer: Self-pay | Admitting: *Deleted

## 2013-08-07 DIAGNOSIS — Z79899 Other long term (current) drug therapy: Secondary | ICD-10-CM | POA: Insufficient documentation

## 2013-08-07 DIAGNOSIS — G43909 Migraine, unspecified, not intractable, without status migrainosus: Secondary | ICD-10-CM | POA: Insufficient documentation

## 2013-08-07 DIAGNOSIS — Z8659 Personal history of other mental and behavioral disorders: Secondary | ICD-10-CM | POA: Insufficient documentation

## 2013-08-07 DIAGNOSIS — Z8669 Personal history of other diseases of the nervous system and sense organs: Secondary | ICD-10-CM | POA: Insufficient documentation

## 2013-08-07 DIAGNOSIS — Z8739 Personal history of other diseases of the musculoskeletal system and connective tissue: Secondary | ICD-10-CM | POA: Insufficient documentation

## 2013-08-07 DIAGNOSIS — Z87718 Personal history of other specified (corrected) congenital malformations of genitourinary system: Secondary | ICD-10-CM | POA: Insufficient documentation

## 2013-08-07 MED ORDER — DEXAMETHASONE SODIUM PHOSPHATE 10 MG/ML IJ SOLN
10.0000 mg | Freq: Once | INTRAMUSCULAR | Status: AC
  Administered 2013-08-07: 10 mg via INTRAMUSCULAR
  Filled 2013-08-07: qty 1

## 2013-08-07 MED ORDER — METOCLOPRAMIDE HCL 5 MG/ML IJ SOLN: 2.0000 mg/kg | Freq: Once | INTRAVENOUS | Status: DC

## 2013-08-07 MED ORDER — DIPHENHYDRAMINE HCL 50 MG/ML IJ SOLN
25.0000 mg | Freq: Once | INTRAMUSCULAR | Status: AC
  Administered 2013-08-07: 25 mg via INTRAMUSCULAR
  Filled 2013-08-07: qty 1

## 2013-08-07 MED ORDER — METOCLOPRAMIDE HCL 5 MG/ML IJ SOLN
10.0000 mg | Freq: Once | INTRAMUSCULAR | Status: AC
  Administered 2013-08-07: 10 mg via INTRAMUSCULAR
  Filled 2013-08-07: qty 2

## 2013-08-07 MED ORDER — METOCLOPRAMIDE HCL 10 MG PO TABS: 10.0000 mg | ORAL_TABLET | Freq: Four times a day (QID) | ORAL | Status: DC

## 2013-08-07 NOTE — ED Notes (Signed)
The pt has had  A headache for 7 days.  She had a rx but was unable to get it filled  Due to her  Insurance.  nv also

## 2013-08-07 NOTE — ED Provider Notes (Signed)
CSN: [id number]     Arrival date & time 08/07/13  1523 History   First MD Initiated Contact with Patient 08/07/13 1536     Chief Complaint  Patient presents with  . Headache   (Consider location/radiation/quality/duration/timing/severity/associated sxs/prior Treatment) HPI Wendy [name] is a 27 y.o. female who presents to ED today with complaint of a headache. States has hx of migraine headaches. States this headache started a week ago. States it is mainly in the front. Admits to photosensetivity and sensetivity to sounds. States has been followed by neurologist and PCP. States her doctor has tried different medications for it with no improvement in the past. States usually ends up resolving on its own. States this time, went to her PCP and they decided to try maxalt, but states she is unable to fill it because her insurance does not cover it. Pt called her neurologist today but he is out for the rest of the week. Pt did not take any medications prior to coming in. Denies head injury. Denies fever, chills, neck pain or stiffness. Onset of headache gradual. Similar to prior migraines. Denies nausea, vomiting. No other complaints.    Past Medical History  Diagnosis Date  . Horseshoe kidney   . Scoliosis   . Hearing impaired person   . Anxiety   . Neuropathy     arms, legs and low back   Past Surgical History  Procedure Laterality Date  . Myringotomy      approximately 20 times  . Tonsillectomy     No family history on file. History  Substance Use Topics  . Smoking status: Never Smoker   . Smokeless tobacco: Not on file  . Alcohol Use: No   OB History   Grav Para Term Preterm Abortions TAB SAB Ect Mult Living                 Review of Systems  Constitutional: Negative for fever and chills.  HENT: Negative for neck pain and neck stiffness.   Eyes: Positive for photophobia.  Respiratory: Negative for cough, chest tightness and shortness of breath.   Cardiovascular: Negative  for chest pain, palpitations and leg swelling.  Gastrointestinal: Negative for nausea, vomiting, abdominal pain and diarrhea.  Genitourinary: Negative for dysuria and flank pain.  Musculoskeletal: Negative for myalgias and arthralgias.  Skin: Negative for rash.  Neurological: Positive for headaches. Negative for dizziness, weakness and light-headedness.  All other systems reviewed and are negative.    Allergies  Amoxicillin; Iodine; Lodine; and Phenazopyridine hcl  Home Medications   Current Outpatient Rx  Name  Route  Sig  Dispense  Refill  . docusate sodium (COLACE) 100 MG capsule   Oral   Take 100 mg by mouth 2 (two) times daily.         Marland Kitchen HYDROcodone-acetaminophen (VICODIN) 5-500 MG per tablet   Oral   Take 1 tablet by mouth every 6 (six) hours as needed for pain.           BP 104/64  Pulse 101  Temp(Src) 98.2 F (36.8 C) (Oral)  Resp 18  SpO2 98% Physical Exam  Nursing note and vitals reviewed. Constitutional: She is oriented to person, place, and time. She appears well-developed and well-nourished. No distress.  HENT:  Head: Normocephalic and atraumatic.  Right Ear: External ear normal.  Left Ear: External ear normal.  Mouth/Throat: Oropharynx is clear and moist.  Eyes: Conjunctivae and EOM are normal. Pupils are equal, round, and reactive to light.  Neck: Neck supple.  Cardiovascular: Normal rate, regular rhythm and normal heart sounds.   Pulmonary/Chest: Effort normal and breath sounds normal. No respiratory distress. She has no wheezes. She has no rales.  Neurological: She is alert and oriented to person, place, and time. No cranial nerve deficit. Coordination normal.  5/5 and equal upper and lower extremity strength bilaterally. Equal grip strength bilaterally. Normal finger to nose and heel to shin. No pronator drift.   Skin: Skin is warm and dry.  Psychiatric: She has a normal mood and affect. Her behavior is normal.    ED Course  Procedures  (including critical care time)   Labs Review Labs Reviewed - No data to display  Imaging Review No results found.  MDM   1. Migraine headache     3:56 PM Pt seen and examined. Typical for her migraine. Unable to fill her medication that she was prescribed because insurance does not cover it. Thinks it is Maxalt. Pt is followed by neurologist and PCP for this. No meningismus, no recent tick exposure, no fever, no neuro deficits. Gradual in onset. Will try Migraine cocktail: decadron, reglan, beandryl IM. WIll reassess.   4:34 PM Pt feels better with medications, but still having pain. Will give another 10 min, will reassess.   4:47 PM Pt feels even better. She is comfortable going home. She asked to get medication to take at home, however, i am not sure and she is not sure of what she has tried in the past and what worked and what didn't. Will try PO reglan. Will d/c home with follow up.   Filed Vitals:   08/07/13 1532  BP: 104/64  Pulse: 101  Temp: 98.2 F (36.8 C)  Resp: 18     Devonda Pequignot A Atleigh Gruen, PA-C 08/07/13 1649

## 2013-08-08 NOTE — ED Provider Notes (Signed)
Medical screening examination/treatment/procedure(s) were performed by non-physician practitioner and as supervising physician I was immediately available for consultation/collaboration.   Malinda Mayden David Field Staniszewski, MD 08/08/13 1301 

## 2014-07-28 ENCOUNTER — Encounter (HOSPITAL_COMMUNITY): Payer: Self-pay | Admitting: Emergency Medicine

## 2014-07-28 ENCOUNTER — Emergency Department (HOSPITAL_COMMUNITY): Payer: Medicaid Other

## 2014-07-28 ENCOUNTER — Emergency Department (HOSPITAL_COMMUNITY)
Admission: EM | Admit: 2014-07-28 | Discharge: 2014-07-28 | Disposition: A | Discharge status: Home / Self Care | Payer: Medicaid Other | Attending: Emergency Medicine | Admitting: Emergency Medicine

## 2014-07-28 DIAGNOSIS — F411 Generalized anxiety disorder: Secondary | ICD-10-CM | POA: Diagnosis not present

## 2014-07-28 DIAGNOSIS — Z880 Allergy status to penicillin: Secondary | ICD-10-CM | POA: Diagnosis not present

## 2014-07-28 DIAGNOSIS — Q638 Other specified congenital malformations of kidney: Secondary | ICD-10-CM | POA: Insufficient documentation

## 2014-07-28 DIAGNOSIS — Z8739 Personal history of other diseases of the musculoskeletal system and connective tissue: Secondary | ICD-10-CM | POA: Diagnosis not present

## 2014-07-28 DIAGNOSIS — R55 Syncope and collapse: Secondary | ICD-10-CM | POA: Diagnosis not present

## 2014-07-28 DIAGNOSIS — Z8669 Personal history of other diseases of the nervous system and sense organs: Secondary | ICD-10-CM | POA: Diagnosis not present

## 2014-07-28 DIAGNOSIS — Z3202 Encounter for pregnancy test, result negative: Secondary | ICD-10-CM | POA: Insufficient documentation

## 2014-07-28 DIAGNOSIS — R42 Dizziness and giddiness: Secondary | ICD-10-CM | POA: Diagnosis not present

## 2014-07-28 DIAGNOSIS — H9190 Unspecified hearing loss, unspecified ear: Secondary | ICD-10-CM | POA: Insufficient documentation

## 2014-07-28 DIAGNOSIS — R079 Chest pain, unspecified: Secondary | ICD-10-CM | POA: Diagnosis not present

## 2014-07-28 DIAGNOSIS — Z79899 Other long term (current) drug therapy: Secondary | ICD-10-CM | POA: Diagnosis not present

## 2014-07-28 LAB — URINALYSIS, ROUTINE W REFLEX MICROSCOPIC
Bilirubin Urine: NEGATIVE
Glucose, UA: NEGATIVE mg/dL
Hgb urine dipstick: NEGATIVE
KETONES UR: NEGATIVE mg/dL
NITRITE: NEGATIVE
PH: 6 (ref 5.0–8.0)
Protein, ur: NEGATIVE mg/dL
SPECIFIC GRAVITY, URINE: 1.017 (ref 1.005–1.030)
Urobilinogen, UA: 0.2 mg/dL (ref 0.0–1.0)

## 2014-07-28 LAB — URINE MICROSCOPIC-ADD ON

## 2014-07-28 LAB — I-STAT CHEM 8, ED
BUN: 10 mg/dL (ref 6–23)
CALCIUM ION: 1.26 mmol/L — AB (ref 1.12–1.23)
CHLORIDE: 105 meq/L (ref 96–112)
Creatinine, Ser: 0.7 mg/dL (ref 0.50–1.10)
Glucose, Bld: 80 mg/dL (ref 70–99)
HEMATOCRIT: 40 % (ref 36.0–46.0)
Hemoglobin: 13.6 g/dL (ref 12.0–15.0)
POTASSIUM: 3.9 meq/L (ref 3.7–5.3)
SODIUM: 140 meq/L (ref 137–147)
TCO2: 28 mmol/L (ref 0–100)

## 2014-07-28 LAB — D-DIMER, QUANTITATIVE (NOT AT ARMC)

## 2014-07-28 LAB — POC URINE PREG, ED: PREG TEST UR: NEGATIVE

## 2014-07-28 MED ORDER — SODIUM CHLORIDE 0.9 % IV BOLUS (SEPSIS)
500.0000 mL | Freq: Once | INTRAVENOUS | Status: AC
  Administered 2014-07-28: 500 mL via INTRAVENOUS

## 2014-07-28 MED ORDER — ONDANSETRON HCL 4 MG/2ML IJ SOLN
4.0000 mg | Freq: Once | INTRAMUSCULAR | Status: AC
  Administered 2014-07-28: 4 mg via INTRAVENOUS
  Filled 2014-07-28: qty 2

## 2014-07-28 MED ORDER — MECLIZINE HCL 25 MG PO TABS
25.0000 mg | ORAL_TABLET | Freq: Once | ORAL | Status: AC
  Administered 2014-07-28: 25 mg via ORAL
  Filled 2014-07-28: qty 1

## 2014-07-28 MED ORDER — ONDANSETRON HCL 4 MG PO TABS: 4.0000 mg | ORAL_TABLET | Freq: Four times a day (QID) | ORAL | Status: AC

## 2014-07-28 MED ORDER — MECLIZINE HCL 50 MG PO TABS: 50.0000 mg | ORAL_TABLET | Freq: Three times a day (TID) | ORAL | Status: AC | PRN

## 2014-07-28 MED ORDER — SODIUM CHLORIDE 0.9 % IV BOLUS (SEPSIS)
1000.0000 mL | Freq: Once | INTRAVENOUS | Status: AC
Start: 2014-07-28 — End: 2014-07-28
  Administered 2014-07-28: 1000 mL via INTRAVENOUS

## 2014-07-28 NOTE — ED Provider Notes (Signed)
CSN: [id number]     Arrival date & time 07/28/14  1440 History   First MD Initiated Contact with Patient 07/28/14 1837     Chief Complaint  Patient presents with  . Dizziness  . Near Syncope     (Consider location/radiation/quality/duration/timing/severity/associated sxs/prior Treatment) HPI  Patient to the ER with complaints of dizziness for the past 2-3 days. She also complains of having some mild anxiety, intermittent bilateral rib pain and nausea. She describes the dizziness as sometimes like rocking on a boat and sometimes likes spinning in circles. She is alert and oriented and describes feeling globally weak but no focal weakness. She had surgery on her left wrist to remove hardware after a fracture repair from 1 year ago. She denies lower extremity swelling. She is not on any birth control. She is not having any shortness of breath and no coughing.  Past Medical History  Diagnosis Date  . Horseshoe kidney   . Scoliosis   . Hearing impaired person   . Anxiety   . Neuropathy     arms, legs and low back   Past Surgical History  Procedure Laterality Date  . Myringotomy      approximately 20 times  . Tonsillectomy     No family history on file. History  Substance Use Topics  . Smoking status: Never Smoker   . Smokeless tobacco: Not on file  . Alcohol Use: No   OB History   Grav Para Term Preterm Abortions TAB SAB Ect Mult Living                 Review of Systems   Review of Systems  Gen: no weight loss, fevers, chills, night sweats  Eyes: no occular draining, occular pain,  No visual changes  Nose: no epistaxis or rhinorrhea  Mouth: no dental pain, no sore throat  Neck: no neck pain  Lungs: No hemoptysis. No wheezing or coughing CV:  No palpitations, dependent edema or orthopnea. No chest pain Abd: no diarrhea. No nausea or vomiting, No abdominal pain  GU: no dysuria or gross hematuria  MSK:  No muscle weakness, No muscular pain Neuro: no headache, no focal  neurologic deficits + dizziness Skin: no rash , no wounds Psyche: no complaints of depression or anxiety    Allergies  Amoxicillin; Iodine; Lodine; and Phenazopyridine hcl  Home Medications   Prior to Admission medications   Medication Sig Start Date End Date Taking? Authorizing Provider  docusate sodium (COLACE) 100 MG capsule Take 200 mg by mouth at bedtime.    Yes Historical Provider, MD  HYDROcodone-acetaminophen (VICODIN) 5-500 MG per tablet Take 1 tablet by mouth every 6 (six) hours as needed for pain.    Yes Historical Provider, MD  Linaclotide (LINZESS PO) Take 1 tablet by mouth daily.    Yes Historical Provider, MD  meclizine (ANTIVERT) 50 MG tablet Take 1 tablet (50 mg total) by mouth 3 (three) times daily as needed. 07/28/14   Phuong Hillary Irine Seal, PA-C  ondansetron (ZOFRAN) 4 MG tablet Take 1 tablet (4 mg total) by mouth every 6 (six) hours. 07/28/14   Josephine Wooldridge Irine Seal, PA-C   BP 102/73  Pulse 63  Temp(Src) 97.6 F (36.4 C) (Oral)  Resp 14  SpO2 98%  LMP 07/21/2014 Physical Exam  Nursing note and vitals reviewed. Constitutional: She is oriented to person, place, and time. She appears well-developed and well-nourished. No distress.  HENT:  Head: Normocephalic and atraumatic.  Eyes: Pupils are equal, round, and  reactive to light.  Neck: Trachea normal and normal range of motion. Neck supple. No spinous process tenderness and no muscular tenderness present. No Brudzinski's sign noted.  Cardiovascular: Normal rate and regular rhythm.   Pulmonary/Chest: Effort normal.  Abdominal: Soft.  Neurological: She is alert and oriented to person, place, and time. She has normal strength. No cranial nerve deficit or sensory deficit. She displays a negative Romberg sign. GCS eye subscore is 4. GCS verbal subscore is 5. GCS motor subscore is 6.  Skin: Skin is warm and dry.    ED Course  Procedures (including critical care time) Labs Review Labs Reviewed  URINALYSIS, ROUTINE W REFLEX  MICROSCOPIC - Abnormal; Notable for the following:    APPearance TURBID (*)    Leukocytes, UA MODERATE (*)    All other components within normal limits  URINE MICROSCOPIC-ADD ON - Abnormal; Notable for the following:    Squamous Epithelial / LPF MANY (*)    Bacteria, UA FEW (*)    All other components within normal limits  I-STAT CHEM 8, ED - Abnormal; Notable for the following:    Calcium, Ion 1.26 (*)    All other components within normal limits  D-DIMER, QUANTITATIVE  POC URINE PREG, ED    Imaging Review Dg Chest 2 View  07/28/2014   CLINICAL DATA:  Bilateral lower rib pain with dizziness for 3 days. No known injury.  EXAM: CHEST  2 VIEW  COMPARISON:  Abdominal CT and acute abdominal series performed 06/25/2013.  FINDINGS: The heart size and mediastinal contours are normal. The lungs are clear. There is no pleural effusion or pneumothorax. No acute osseous findings are identified.  IMPRESSION: No active cardiopulmonary process.   Electronically Signed   By: Roxy Horseman M.D.   On: 07/28/2014 20:59     EKG Interpretation   Date/Time:  Wednesday July 28 2014 14:50:53 EDT Ventricular Rate:  84 PR Interval:  182 QRS Duration: 86 QT Interval:  390 QTC Calculation: 460 R Axis:   47 Text Interpretation:  Normal sinus rhythm Normal ECG No significant change  since last tracing Confirmed by GOLDSTON  MD, SCOTT ([id number]) on 07/28/2014  8:17:17 PM      MDM   Final diagnoses:  Vertigo    Medications  sodium chloride 0.9 % bolus 1,000 mL (0 mLs Intravenous Stopped 07/28/14 2156)  ondansetron (ZOFRAN) injection 4 mg (4 mg Intravenous Given 07/28/14 2025)  meclizine (ANTIVERT) tablet 25 mg (25 mg Oral Given 07/28/14 2025)  meclizine (ANTIVERT) tablet 25 mg (25 mg Oral Given 07/28/14 2201)  sodium chloride 0.9 % bolus 500 mL (0 mLs Intravenous Stopped 07/28/14 2301)     Patient does not look sick and had significant improvement with interventions in the ED. She was ambulated without  difficulty and did not appear to be orthostatic on arrival. She feels much better and feels comfortable going home.  28 y.o.Wendy [name]'s  with back pain. No neurological deficits and normal neuro exam. Patient can walk. No loss of bowel or bladder control.No fever, night sweats, weight loss, h/o cancer, IVDU.   RICE protocol and pain medicine indicated and discussed with patient.   Patient Plan 1. Treatment: rest, drink plenty of fluids. Take Antivert as needed for dizziness. Neurology referral given. 2.. Follow Up: Please followup with your primary doctor for discussion of your diagnoses and further evaluation after today's visit; if you do not have a primary care doctor use the resource guide provided to find one  Vital signs are stable at discharge. Filed Vitals:   07/28/14 2200  BP: 102/73  Pulse: 63  Temp:   Resp: 14    Patient/guardian has voiced understanding and agreed to follow-up with the PCP or specialist.       Dorthula Matasiffany G Kirandeep Fariss, PA-C 07/29/14 2343

## 2014-07-28 NOTE — ED Notes (Signed)
Pt presents to department for evaluation of dizziness, also reports pain to bilateral rib cages, anxiety, and nausea. Pt is alert and oriented x4. Respirations unlabored.

## 2014-07-28 NOTE — Discharge Instructions (Signed)

## 2014-07-31 NOTE — ED Provider Notes (Signed)
Medical screening examination/treatment/procedure(s) were performed by non-physician practitioner and as supervising physician I was immediately available for consultation/collaboration.   EKG Interpretation   Date/Time:  Wednesday July 28 2014 14:50:53 EDT Ventricular Rate:  84 PR Interval:  182 QRS Duration: 86 QT Interval:  390 QTC Calculation: 460 R Axis:   47 Text Interpretation:  Normal sinus rhythm Normal ECG No significant change  since last tracing Confirmed by Brandonn Capelli  MD, Karl Knarr ([id number]) on 07/28/2014  8:17:17 PM        Audree Camel, MD 07/31/14 [phone]

## 2015-01-06 ENCOUNTER — Emergency Department (HOSPITAL_COMMUNITY)
Admission: EM | Admit: 2015-01-06 | Discharge: 2015-01-06 | Disposition: A | Discharge status: Home / Self Care | Payer: Medicaid Other | Attending: Emergency Medicine | Admitting: Emergency Medicine

## 2015-01-06 ENCOUNTER — Encounter (HOSPITAL_COMMUNITY): Payer: Self-pay

## 2015-01-06 DIAGNOSIS — M419 Scoliosis, unspecified: Secondary | ICD-10-CM | POA: Diagnosis not present

## 2015-01-06 DIAGNOSIS — Z8659 Personal history of other mental and behavioral disorders: Secondary | ICD-10-CM | POA: Diagnosis not present

## 2015-01-06 DIAGNOSIS — Q631 Lobulated, fused and horseshoe kidney: Secondary | ICD-10-CM | POA: Diagnosis not present

## 2015-01-06 DIAGNOSIS — H9190 Unspecified hearing loss, unspecified ear: Secondary | ICD-10-CM | POA: Diagnosis not present

## 2015-01-06 DIAGNOSIS — Z3202 Encounter for pregnancy test, result negative: Secondary | ICD-10-CM | POA: Insufficient documentation

## 2015-01-06 DIAGNOSIS — Z880 Allergy status to penicillin: Secondary | ICD-10-CM | POA: Diagnosis not present

## 2015-01-06 DIAGNOSIS — G43709 Chronic migraine without aura, not intractable, without status migrainosus: Secondary | ICD-10-CM | POA: Diagnosis not present

## 2015-01-06 DIAGNOSIS — Z79899 Other long term (current) drug therapy: Secondary | ICD-10-CM | POA: Diagnosis not present

## 2015-01-06 DIAGNOSIS — R51 Headache: Secondary | ICD-10-CM | POA: Diagnosis present

## 2015-01-06 LAB — POC URINE PREG, ED: PREG TEST UR: NEGATIVE

## 2015-01-06 MED ORDER — SODIUM CHLORIDE 0.9 % IV BOLUS (SEPSIS)
1000.0000 mL | Freq: Once | INTRAVENOUS | Status: AC
  Administered 2015-01-06: 1000 mL via INTRAVENOUS

## 2015-01-06 MED ORDER — KETOROLAC TROMETHAMINE 15 MG/ML IJ SOLN
15.0000 mg | Freq: Once | INTRAMUSCULAR | Status: AC
  Administered 2015-01-06: 15 mg via INTRAVENOUS
  Filled 2015-01-06: qty 1

## 2015-01-06 MED ORDER — DIPHENHYDRAMINE HCL 50 MG/ML IJ SOLN
25.0000 mg | Freq: Once | INTRAMUSCULAR | Status: AC
  Administered 2015-01-06: 25 mg via INTRAVENOUS
  Filled 2015-01-06: qty 1

## 2015-01-06 MED ORDER — DEXAMETHASONE SODIUM PHOSPHATE 10 MG/ML IJ SOLN
10.0000 mg | Freq: Once | INTRAMUSCULAR | Status: AC
  Administered 2015-01-06: 10 mg via INTRAVENOUS
  Filled 2015-01-06: qty 1

## 2015-01-06 MED ORDER — METOCLOPRAMIDE HCL 5 MG/ML IJ SOLN
10.0000 mg | Freq: Once | INTRAMUSCULAR | Status: AC
  Administered 2015-01-06: 10 mg via INTRAVENOUS
  Filled 2015-01-06: qty 2

## 2015-01-06 NOTE — ED Provider Notes (Signed)
CSN: [id number]     Arrival date & time 01/06/15  0757 History   First MD Initiated Contact with Patient 01/06/15 0802     Chief Complaint  Patient presents with  . Migraine  . Emesis     (Consider location/radiation/quality/duration/timing/severity/associated sxs/prior Treatment) Patient is a 29 y.o. female presenting with migraines.  Migraine This is a recurrent problem. Episode onset: 3 days ago. The problem occurs constantly. The problem has not changed since onset.Pertinent negatives include no chest pain, no abdominal pain, no headaches and no shortness of breath. Nothing aggravates the symptoms. Nothing relieves the symptoms. She has tried nothing for the symptoms.    Past Medical History  Diagnosis Date  . Horseshoe kidney   . Scoliosis   . Hearing impaired person   . Anxiety   . Neuropathy     arms, legs and low back   Past Surgical History  Procedure Laterality Date  . Myringotomy      approximately 20 times  . Tonsillectomy     History reviewed. No pertinent family history. History  Substance Use Topics  . Smoking status: Never Smoker   . Smokeless tobacco: Not on file  . Alcohol Use: No   OB History    No data available     Review of Systems  Respiratory: Negative for shortness of breath.   Cardiovascular: Negative for chest pain.  Gastrointestinal: Negative for abdominal pain.  Neurological: Negative for headaches.  All other systems reviewed and are negative.     Allergies  Amoxicillin  Home Medications   Prior to Admission medications   Medication Sig Start Date End Date Taking? Authorizing Provider  docusate sodium (COLACE) 100 MG capsule Take 200 mg by mouth every evening.    Yes Historical Provider, MD  HYDROcodone-acetaminophen (NORCO/VICODIN) 5-325 MG per tablet Take 1 tablet by mouth every 8 (eight) hours as needed for moderate pain.   Yes Historical Provider, MD  ibuprofen (ADVIL,MOTRIN) 800 MG tablet Take 800 mg by mouth daily as  needed for fever or moderate pain.   Yes Historical Provider, MD  Linaclotide (LINZESS) 290 MCG CAPS capsule Take 290 mcg by mouth daily.   Yes Historical Provider, MD  meclizine (ANTIVERT) 50 MG tablet Take 1 tablet (50 mg total) by mouth 3 (three) times daily as needed. Patient not taking: Reported on 01/06/2015 07/28/14   Dorthula Matas, PA-C  ondansetron (ZOFRAN) 4 MG tablet Take 1 tablet (4 mg total) by mouth every 6 (six) hours. Patient not taking: Reported on 01/06/2015 07/28/14   Dorthula Matas, PA-C   BP 106/77 mmHg  Pulse 86  Temp(Src) 98.3 F (36.8 C) (Oral)  Resp 16  SpO2 100%  LMP 12/30/2014 Physical Exam  Constitutional: She is oriented to person, place, and time. She appears well-developed and well-nourished.  HENT:  Head: Normocephalic and atraumatic.  Right Ear: External ear normal.  Left Ear: External ear normal.  Eyes: Conjunctivae and EOM are normal. Pupils are equal, round, and reactive to light.  Neck: Normal range of motion. Neck supple.  Cardiovascular: Normal rate, regular rhythm, normal heart sounds and intact distal pulses.   Pulmonary/Chest: Effort normal and breath sounds normal.  Abdominal: Soft. Bowel sounds are normal. There is no tenderness.  Musculoskeletal: Normal range of motion.  Neurological: She is alert and oriented to person, place, and time. She has normal strength and normal reflexes. No cranial nerve deficit or sensory deficit. Gait normal. GCS eye subscore is 4. GCS verbal subscore is  5. GCS motor subscore is 6.  Skin: Skin is warm and dry.  Vitals reviewed.   ED Course  Procedures (including critical care time) Labs Review Labs Reviewed  POC URINE PREG, ED    Imaging Review No results found.   EKG Interpretation None      MDM   Final diagnoses:  Chronic migraine without aura without status migrainosus, not intractable    29 y.o. female with pertinent PMH of migraine presents with a gr. Patient states symptoms have  lasted longer and are slightly worse than prior migraines. Physical exam with no focal neurodeficits, no ataxia, no meningismus. No fever or other systemic illness reported.  HA relieved by migraine cocktail (reglan, benadryl, decadron).  DC home in stable condition.  I have reviewed all laboratory and imaging studies if ordered as above  1. Chronic migraine without aura without status migrainosus, not intractable         Mirian MoMatthew Shacora Zynda, MD 01/06/15 1021

## 2015-01-06 NOTE — ED Notes (Signed)
Pt states migraine/emesis since Monday.  Unable to keep food/drink down.  Hx of migraines.  No fever

## 2015-01-06 NOTE — Discharge Instructions (Signed)

## 2015-02-17 ENCOUNTER — Encounter (HOSPITAL_COMMUNITY): Payer: Self-pay | Admitting: *Deleted

## 2015-02-17 ENCOUNTER — Emergency Department (HOSPITAL_COMMUNITY)
Admission: EM | Admit: 2015-02-17 | Discharge: 2015-02-17 | Disposition: A | Discharge status: Home / Self Care | Payer: Medicaid Other | Attending: Emergency Medicine | Admitting: Emergency Medicine

## 2015-02-17 ENCOUNTER — Emergency Department (HOSPITAL_COMMUNITY): Payer: Medicaid Other

## 2015-02-17 DIAGNOSIS — S6992XA Unspecified injury of left wrist, hand and finger(s), initial encounter: Secondary | ICD-10-CM | POA: Insufficient documentation

## 2015-02-17 DIAGNOSIS — W010XXA Fall on same level from slipping, tripping and stumbling without subsequent striking against object, initial encounter: Secondary | ICD-10-CM | POA: Insufficient documentation

## 2015-02-17 DIAGNOSIS — Y998 Other external cause status: Secondary | ICD-10-CM | POA: Insufficient documentation

## 2015-02-17 DIAGNOSIS — Y9301 Activity, walking, marching and hiking: Secondary | ICD-10-CM | POA: Insufficient documentation

## 2015-02-17 DIAGNOSIS — Y9389 Activity, other specified: Secondary | ICD-10-CM | POA: Insufficient documentation

## 2015-02-17 DIAGNOSIS — T148XXA Other injury of unspecified body region, initial encounter: Secondary | ICD-10-CM

## 2015-02-17 DIAGNOSIS — Z880 Allergy status to penicillin: Secondary | ICD-10-CM | POA: Insufficient documentation

## 2015-02-17 DIAGNOSIS — Y9289 Other specified places as the place of occurrence of the external cause: Secondary | ICD-10-CM | POA: Insufficient documentation

## 2015-02-17 DIAGNOSIS — Z79899 Other long term (current) drug therapy: Secondary | ICD-10-CM | POA: Insufficient documentation

## 2015-02-17 DIAGNOSIS — Q631 Lobulated, fused and horseshoe kidney: Secondary | ICD-10-CM | POA: Diagnosis not present

## 2015-02-17 DIAGNOSIS — H9190 Unspecified hearing loss, unspecified ear: Secondary | ICD-10-CM | POA: Diagnosis not present

## 2015-02-17 DIAGNOSIS — S199XXA Unspecified injury of neck, initial encounter: Secondary | ICD-10-CM | POA: Diagnosis not present

## 2015-02-17 DIAGNOSIS — S4991XA Unspecified injury of right shoulder and upper arm, initial encounter: Secondary | ICD-10-CM | POA: Insufficient documentation

## 2015-02-17 DIAGNOSIS — Z8659 Personal history of other mental and behavioral disorders: Secondary | ICD-10-CM | POA: Diagnosis not present

## 2015-02-17 DIAGNOSIS — Z8739 Personal history of other diseases of the musculoskeletal system and connective tissue: Secondary | ICD-10-CM | POA: Diagnosis not present

## 2015-02-17 DIAGNOSIS — Z8669 Personal history of other diseases of the nervous system and sense organs: Secondary | ICD-10-CM | POA: Insufficient documentation

## 2015-02-17 DIAGNOSIS — W19XXXA Unspecified fall, initial encounter: Secondary | ICD-10-CM

## 2015-02-17 LAB — CBG MONITORING, ED: GLUCOSE-CAPILLARY: 97 mg/dL (ref 70–99)

## 2015-02-17 MED ORDER — IBUPROFEN 400 MG PO TABS
600.0000 mg | ORAL_TABLET | Freq: Once | ORAL | Status: AC
  Administered 2015-02-17: 600 mg via ORAL
  Filled 2015-02-17 (×2): qty 1

## 2015-02-17 NOTE — ED Notes (Signed)
Pt returned from radiology.

## 2015-02-17 NOTE — ED Notes (Signed)
Pt is stable upon d/c and is escorted by wheelchair from ED by RN.

## 2015-02-17 NOTE — Discharge Instructions (Signed)
Contusion °A contusion is a deep bruise. Contusions happen when an injury causes bleeding under the skin. Signs of bruising include pain, puffiness (swelling), and discolored skin. The contusion may turn blue, purple, or yellow. °HOME CARE  °· Put ice on the injured area. °¨ Put ice in a plastic bag. °¨ Place a towel between your skin and the bag. °¨ Leave the ice on for 15-20 minutes, 03-04 times a day. °· Only take medicine as told by your doctor. °· Rest the injured area. °· If possible, raise (elevate) the injured area to lessen puffiness. °GET HELP RIGHT AWAY IF:  °· You have more bruising or puffiness. °· You have pain that is getting worse. °· Your puffiness or pain is not helped by medicine. °MAKE SURE YOU:  °· Understand these instructions. °· Will watch your condition. °· Will get help right away if you are not doing well or get worse. °Document Released: 05/14/2008 Document Revised: 02/18/2012 Document Reviewed: 10/01/2011 °[employer]® Patient Information ©2015 [employer]. This information is not intended to replace advice given to you by your health care provider. Make sure you discuss any questions you have with your health care provider. ° °

## 2015-02-17 NOTE — ED Notes (Signed)
Pt arrives from home via [facility]. Pt states she fell while taking a walk outside. Pt reports she hit her head with no loc. Pt states her right arm hurts from her neck down to her fingers. Pt also states she is having a h/a with blurry/double vision and lightheadedness. Pt has superficial abrasions to right arm and left palm. There are no obvious deformities, but she is tender to touch on her neck and her right arm. Pt currently has full ROM.

## 2015-02-17 NOTE — ED Provider Notes (Signed)
CSN: [id number]     Arrival date & time 02/17/15  1549 History   First MD Initiated Contact with Patient 02/17/15 [phone]     Chief Complaint  Patient presents with  . Fall     (Consider location/radiation/quality/duration/timing/severity/associated sxs/prior Treatment) HPI   29 y/o female w/ no sig PMH, not on blood thinners.  Patient sufferred a mechanical fall prior to arrival.  Sidewalk was crooked, patient tripped.  + LOC but brief.  Woke up feeling back at baseline.  Complaining of right arm and left hand pain.  The pain is dull, worse w/ movement, better w/ rest.  Also complaining of some right sided neck pain.  No numbness/weakness in upper/lower extremities.   Past Medical History  Diagnosis Date  . Horseshoe kidney   . Scoliosis   . Hearing impaired person   . Anxiety   . Neuropathy     arms, legs and low back   Past Surgical History  Procedure Laterality Date  . Myringotomy      approximately 20 times  . Tonsillectomy     History reviewed. No pertinent family history. History  Substance Use Topics  . Smoking status: Never Smoker   . Smokeless tobacco: Not on file  . Alcohol Use: No   OB History    No data available     Review of Systems  Constitutional: Negative for fever and chills.  HENT: Negative for nosebleeds.   Eyes: Negative for visual disturbance.  Respiratory: Negative for cough and shortness of breath.   Cardiovascular: Negative for chest pain.  Gastrointestinal: Negative for nausea, vomiting, abdominal pain, diarrhea and constipation.  Genitourinary: Negative for dysuria.  Musculoskeletal: Positive for neck pain.       Right arm and left hand pain  Skin: Negative for rash.  Neurological: Negative for weakness.      Allergies  Amoxicillin  Home Medications   Prior to Admission medications   Medication Sig Start Date End Date Taking? Authorizing Provider  docusate sodium (COLACE) 100 MG capsule Take 200 mg by mouth every evening.     Yes Historical Provider, MD  HYDROcodone-acetaminophen (NORCO/VICODIN) 5-325 MG per tablet Take 1 tablet by mouth every 8 (eight) hours as needed for moderate pain.   Yes Historical Provider, MD  ibuprofen (ADVIL,MOTRIN) 800 MG tablet Take 800 mg by mouth daily as needed for fever or moderate pain.   Yes Historical Provider, MD  Linaclotide (LINZESS) 145 MCG CAPS capsule Take 145 mcg by mouth every morning.    Yes Historical Provider, MD  meclizine (ANTIVERT) 50 MG tablet Take 1 tablet (50 mg total) by mouth 3 (three) times daily as needed. Patient not taking: Reported on 01/06/2015 07/28/14   Marlon Peliffany Greene, PA-C  ondansetron (ZOFRAN) 4 MG tablet Take 1 tablet (4 mg total) by mouth every 6 (six) hours. Patient not taking: Reported on 01/06/2015 07/28/14   Marlon Peliffany Greene, PA-C   BP 109/78 mmHg  Pulse 109  Resp 18  SpO2 98%  LMP 02/03/2015 Physical Exam  Constitutional: She is oriented to person, place, and time. No distress.  HENT:  Head: Normocephalic and atraumatic.  Eyes: EOM are normal. Pupils are equal, round, and reactive to light.  Neck:  There is some mild midline and paraspinous muscle ttp  Cardiovascular: Normal rate and intact distal pulses.   Pulmonary/Chest: No respiratory distress.  Abdominal: Soft. There is no tenderness.  Musculoskeletal:  No obvious deformity but complaining of pain to palp diffusely in the RUE.  Also  ttp in the left hand, again w/o obv deformity.  The bilateral upper ext are NVI  Neurological: She is alert and oriented to person, place, and time. No cranial nerve deficit or sensory deficit. Coordination normal.  Normal strength in upper/lower ext  Skin: No rash noted. She is not diaphoretic.    ED Course  Procedures (including critical care time) Labs Review Labs Reviewed  CBG MONITORING, ED    Imaging Review Dg Cervical Spine 2-3 Views  02/17/2015   CLINICAL DATA:  Fall today, tripped and fell on concrete. Hit head. Posterior neck pain. Entire  right arm pain from scapula to hand. No axillary view obtained d/t pt condition and pain level. Right hand has limited ROM, prior to falling, pt unable to straighten fingers completely for imaging. Left hand pain, abrasions to posterior metacarpals.  EXAM: CERVICAL SPINE - 2-3 VIEW  COMPARISON:  09/05/2011  FINDINGS: Negative for fracture or dislocation. Straightening of the normal lordosis. No significant osseous degenerative change. Mild narrowing of C3-4 and C4-5 interspaces as before.  IMPRESSION: 1. Negative for fracture or other acute bone abnormality. 2. Loss of the normal cervical spine lordosis, which may be secondary to positioning, spasm, or soft tissue injury.   Electronically Signed   By: Corlis Leak M.D.   On: 02/17/2015 17:38   Dg Shoulder Right  02/17/2015   CLINICAL DATA:  Fall today, tripped and fell on concrete. Hit head. Posterior neck pain. Entire right arm pain from scapula to hand. No axillary view obtained d/t pt condition and pain level. Right hand has limited ROM, prior to falling, pt unable to straighten fingers completely for imaging. Left hand pain, abrasions to posterior metacarpals.  EXAM: RIGHT SHOULDER - 2+ VIEW  COMPARISON:  None.  FINDINGS: There is no evidence of fracture or dislocation. There is no evidence of arthropathy or other focal bone abnormality. Soft tissues are unremarkable.  IMPRESSION: Negative.   Electronically Signed   By: Corlis Leak M.D.   On: 02/17/2015 17:34   Dg Forearm Right  02/17/2015   CLINICAL DATA:  Fall today, tripped and fell on concrete. Hit head. Posterior neck pain. Entire right arm pain from scapula to hand. No axillary view obtained d/t pt condition and pain level. Right hand has limited ROM, prior to falling, pt unable to straighten fingers completely for imaging. Left hand pain, abrasions to posterior metacarpals.  EXAM: RIGHT FOREARM - 2 VIEW  COMPARISON:  04/02/2004  FINDINGS: There is no evidence of fracture or other focal bone lesions.  Soft tissues are unremarkable.  IMPRESSION: Negative.   Electronically Signed   By: Corlis Leak M.D.   On: 02/17/2015 17:35   Dg Humerus Right  02/17/2015   CLINICAL DATA:  Fall today, tripped and fell on concrete. Hit head. Posterior neck pain. Entire right arm pain from scapula to hand. No axillary view obtained d/t pt condition and pain level. Right hand has limited ROM, prior to falling, pt unable to straighten fingers completely for imaging. Left hand pain, abrasions to posterior metacarpals.  EXAM: RIGHT HUMERUS - 2+ VIEW  COMPARISON:  None.  FINDINGS: There is no evidence of fracture or other focal bone lesions. Soft tissues are unremarkable.  IMPRESSION: Negative.   Electronically Signed   By: Corlis Leak M.D.   On: 02/17/2015 17:35   Dg Hand Complete Left  02/17/2015   CLINICAL DATA:  Fall today, tripped and fell on concrete. Hit head. Posterior neck pain. Entire right arm pain from  scapula to hand. No axillary view obtained d/t pt condition and pain level. Right hand has limited ROM, prior to falling, pt unable to straighten fingers completely for imaging. Left hand pain, abrasions to posterior metacarpals.  EXAM: LEFT HAND - COMPLETE 3+ VIEW  COMPARISON:  None.  FINDINGS: There is no evidence of fracture or dislocation. Short middle phalanx of the little finger. There is no evidence of arthropathy or other focal bone abnormality. Soft tissues are unremarkable.  IMPRESSION: Negative.   Electronically Signed   By: Corlis Leak M.D.   On: 02/17/2015 17:37   Dg Hand Complete Right  02/17/2015   CLINICAL DATA:  Fall today, tripped and fell on concrete. Hit head. Posterior neck pain. Entire right arm pain from scapula to hand. No axillary view obtained d/t pt condition and pain level. Right hand has limited ROM, prior to falling, pt unable to straighten fingers completely for imaging. Left hand pain, abrasions to posterior metacarpals.  EXAM: RIGHT HAND - COMPLETE 3+ VIEW  COMPARISON:  03/17/2005   FINDINGS: There is no evidence of fracture or dislocation. Mild diffuse osteopenia. There is no evidence of arthropathy or other focal bone abnormality. Soft tissues are unremarkable.  IMPRESSION: Negative.   Electronically Signed   By: Corlis Leak M.D.   On: 02/17/2015 17:36     EKG Interpretation None      MDM   Final diagnoses:  Fall  Contusion    29 y/o female w/ no sig PMH, not on blood thinners.  Patient sufferred a mechanical fall prior to arrival.  Sidewalk was crooked, patient tripped.  + LOC but brief.   Doubt dangerous intracranial injury given her age and normal neuro exam as documented above.  No nausea/vomiting since the event and not on blood thinners.  Have obtained plain films of the areas where she has pain as documented above and all identified no acute injuries.    I have discussed the results, Dx and Tx plan with the patient. They expressed understanding and agree with the plan and were told to return to ED with any worsening of condition or concern.    Disposition: Discharge  Condition: Good  Discharge Medication List as of 02/17/2015  5:47 PM      Follow Up: [facility] EMERGENCY DEPARTMENT [address] [id number] [place] [address] [phone]  If symptoms worsen   Pt seen in conjunction with Dr. Janice Coffin, MD 02/18/15 0865  Azalia Bilis, MD 02/22/15 [phone]

## 2016-08-21 IMAGING — CR DG HAND COMPLETE 3+V*R*
3 series · 3 of 3 positions shown · non-contrast
Comparison: 03/17/2005

CLINICAL DATA: Fall today, tripped and fell on concrete. Hit head.
Posterior neck pain. Entire right arm pain from scapula to hand. No
axillary view obtained d/t pt condition and pain level. Right hand
has limited ROM, prior to falling, pt unable to straighten fingers
completely for imaging. Left hand pain, abrasions to posterior
metacarpals.

EXAM:
RIGHT HAND - COMPLETE 3+ VIEW

[x hand pa right]
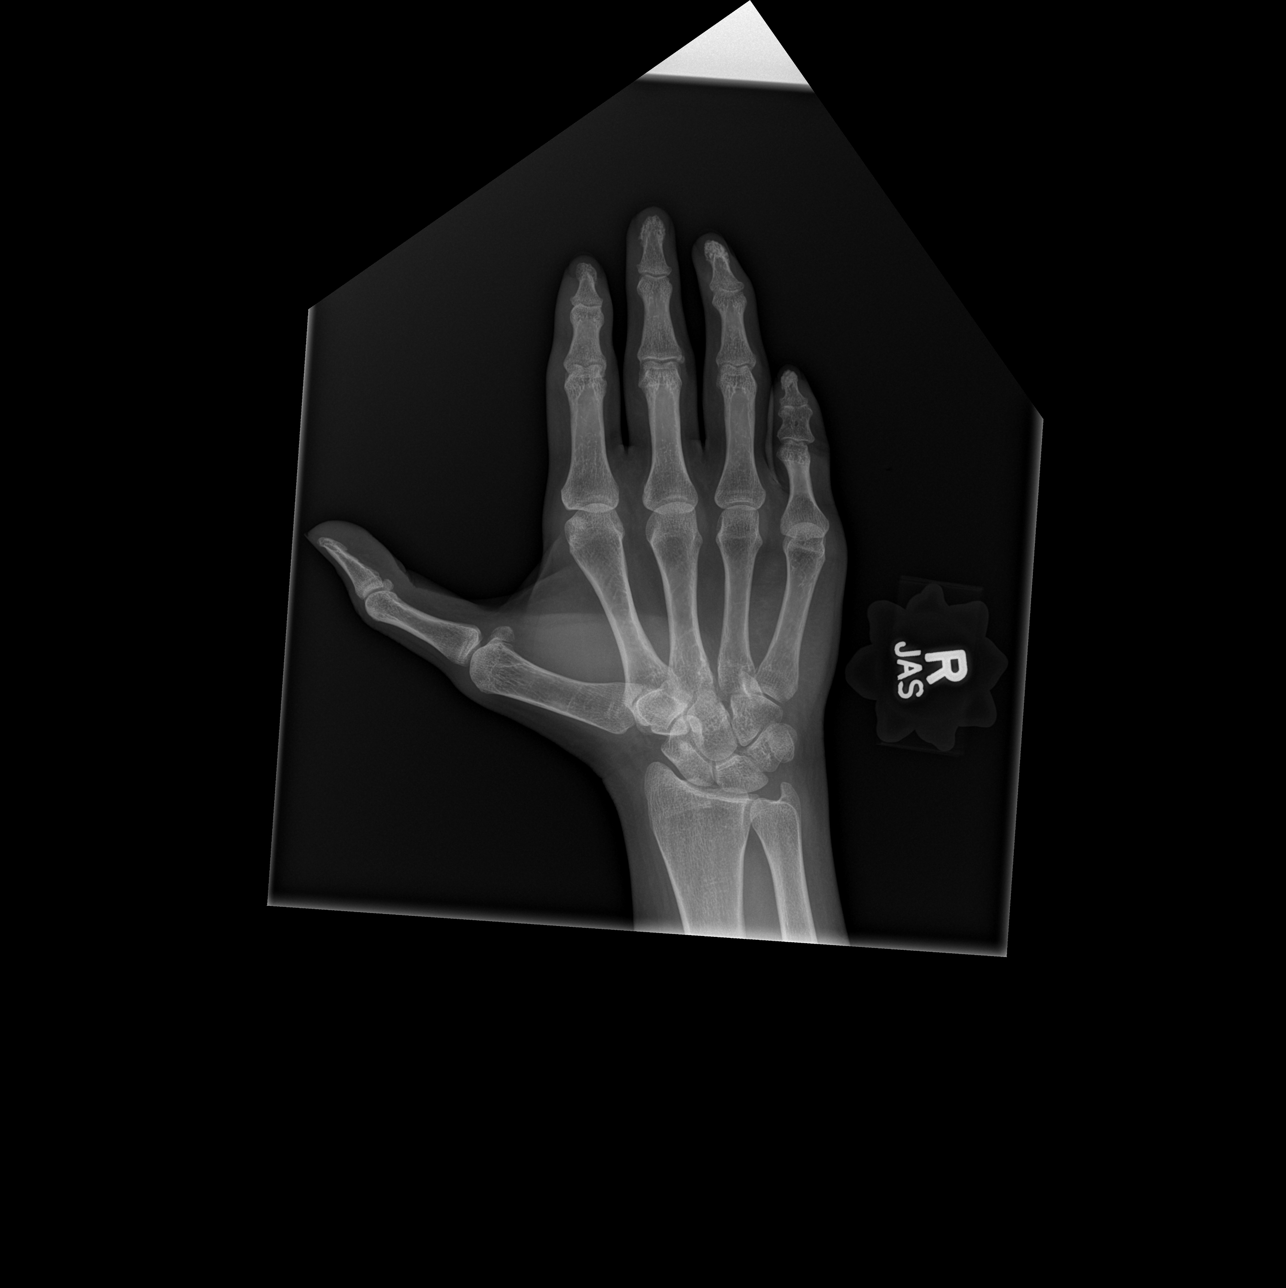

[x hand obl right]
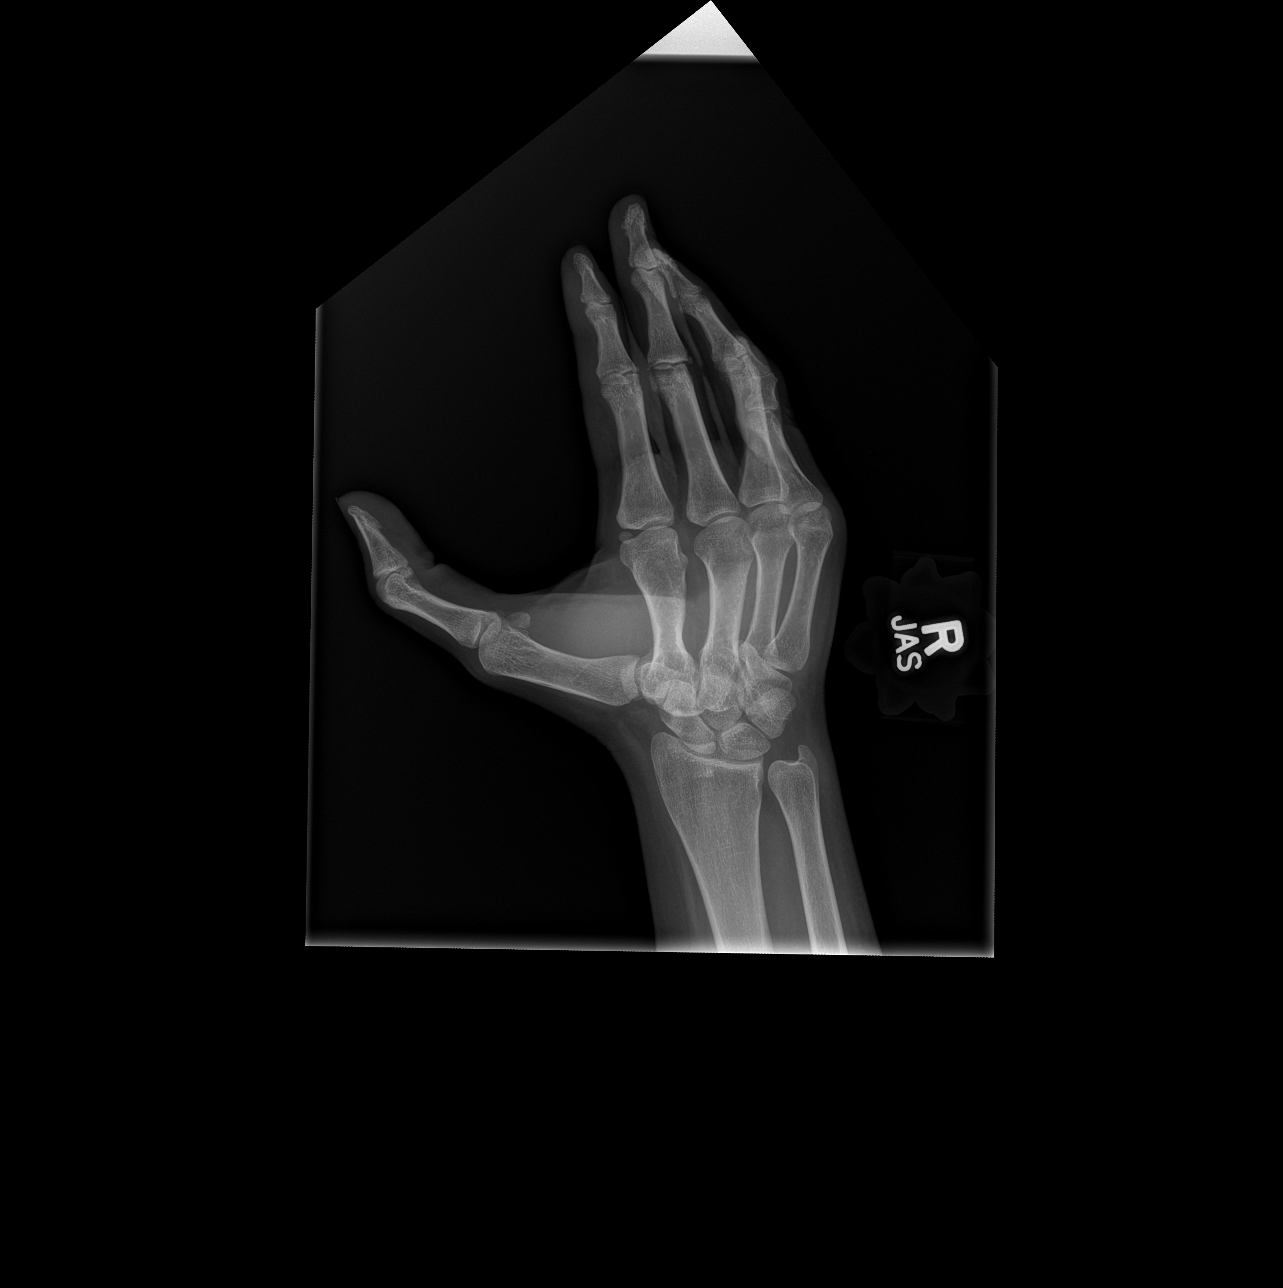

[x hand lat right]
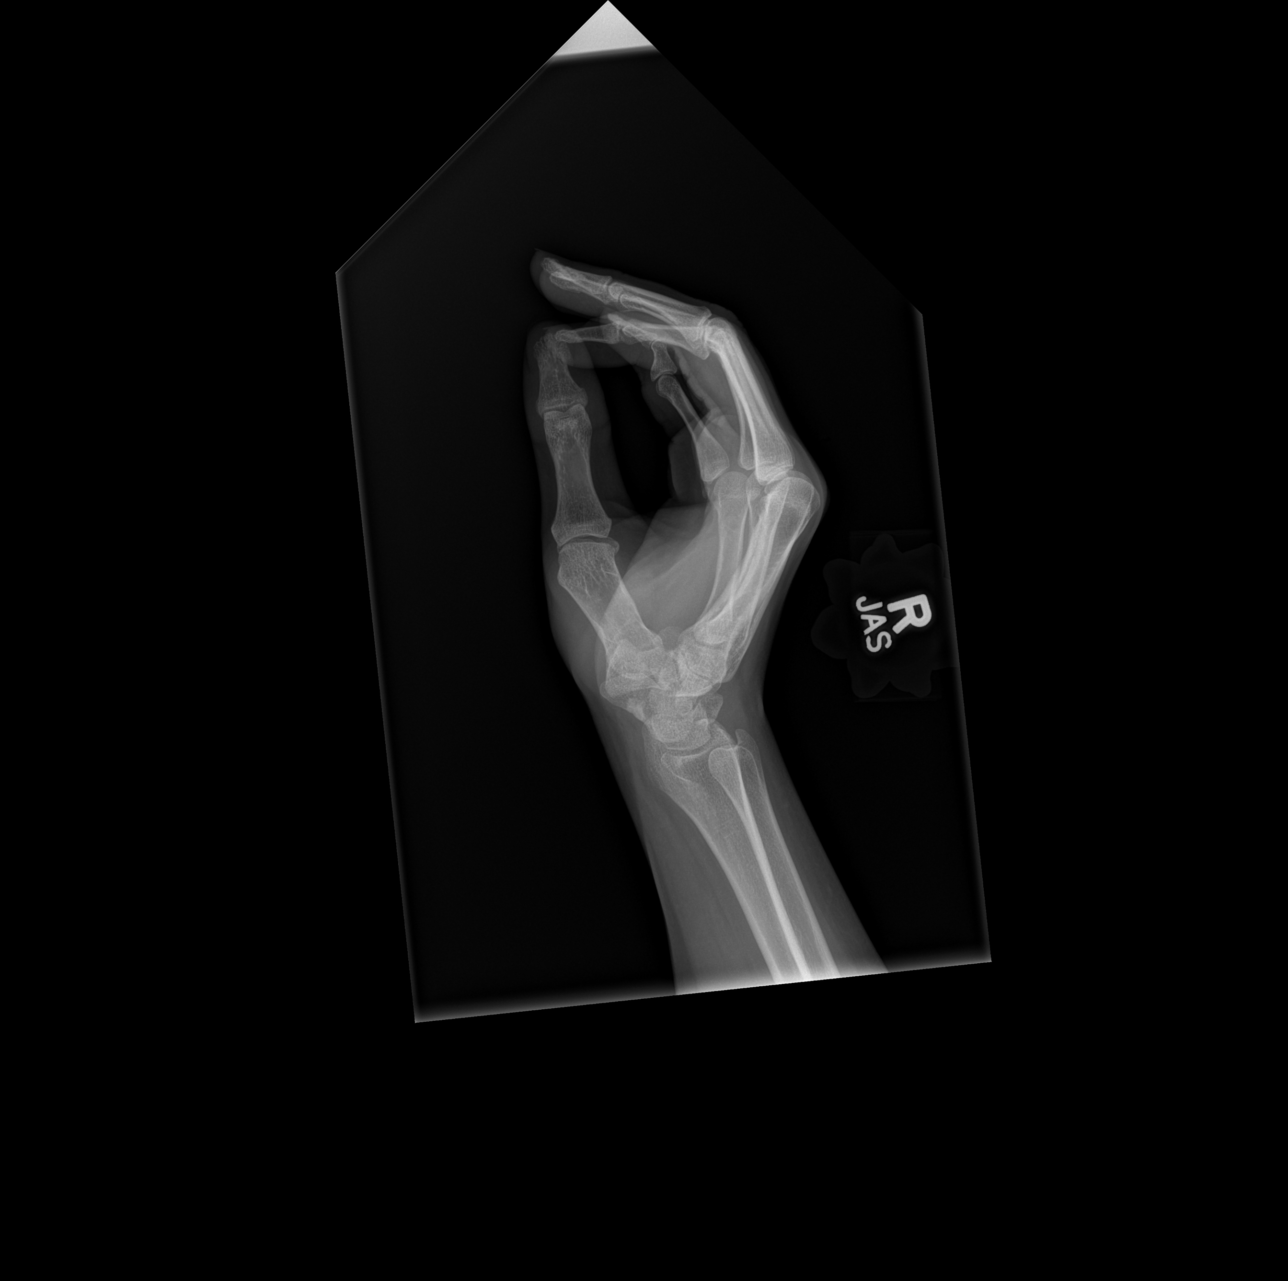

[3 of 3 positions shown; findings below may reference images not displayed]

FINDINGS: There is no evidence of fracture or dislocation. Mild diffuse
osteopenia. There is no evidence of arthropathy or other focal bone
abnormality. Soft tissues are unremarkable.
IMPRESSION: Negative.
